# Patient Record
Sex: Male | Born: 1950 | Race: White | Hispanic: No | Marital: Single | Smoking: Former smoker
Health system: Southern US, Community
[De-identification: ages and names within clinical notes are randomized; demographics above are authoritative.]

## PROBLEM LIST (undated history)

## (undated) DIAGNOSIS — I10 Essential (primary) hypertension: Secondary | ICD-10-CM

## (undated) DIAGNOSIS — E785 Hyperlipidemia, unspecified: Secondary | ICD-10-CM

## (undated) HISTORY — PX: CHOLECYSTECTOMY: SHX55

## (undated) HISTORY — DX: Essential (primary) hypertension: I10

## (undated) HISTORY — DX: Hyperlipidemia, unspecified: E78.5

---

## 1999-10-06 ENCOUNTER — Other Ambulatory Visit: Admission: RE | Admit: 1999-10-06 | Discharge: 1999-10-06 | Payer: Self-pay | Admitting: Internal Medicine

## 1999-10-06 ENCOUNTER — Encounter (INDEPENDENT_AMBULATORY_CARE_PROVIDER_SITE_OTHER): Payer: Self-pay | Admitting: Specialist

## 2004-10-06 ENCOUNTER — Encounter: Admission: RE | Admit: 2004-10-06 | Discharge: 2004-10-06 | Payer: Self-pay | Admitting: Family Medicine

## 2005-08-29 IMAGING — CR DG CHEST 2V
2 series · 2 of 2 positions shown · non-contrast
Comparison: none

CLINICAL DATA: Hypertension.  30-pack-year history of smoking.
 TWO VIEW CHEST RADIOGRAPH 
 No prior studies.

[view not recorded (1 of 2)]
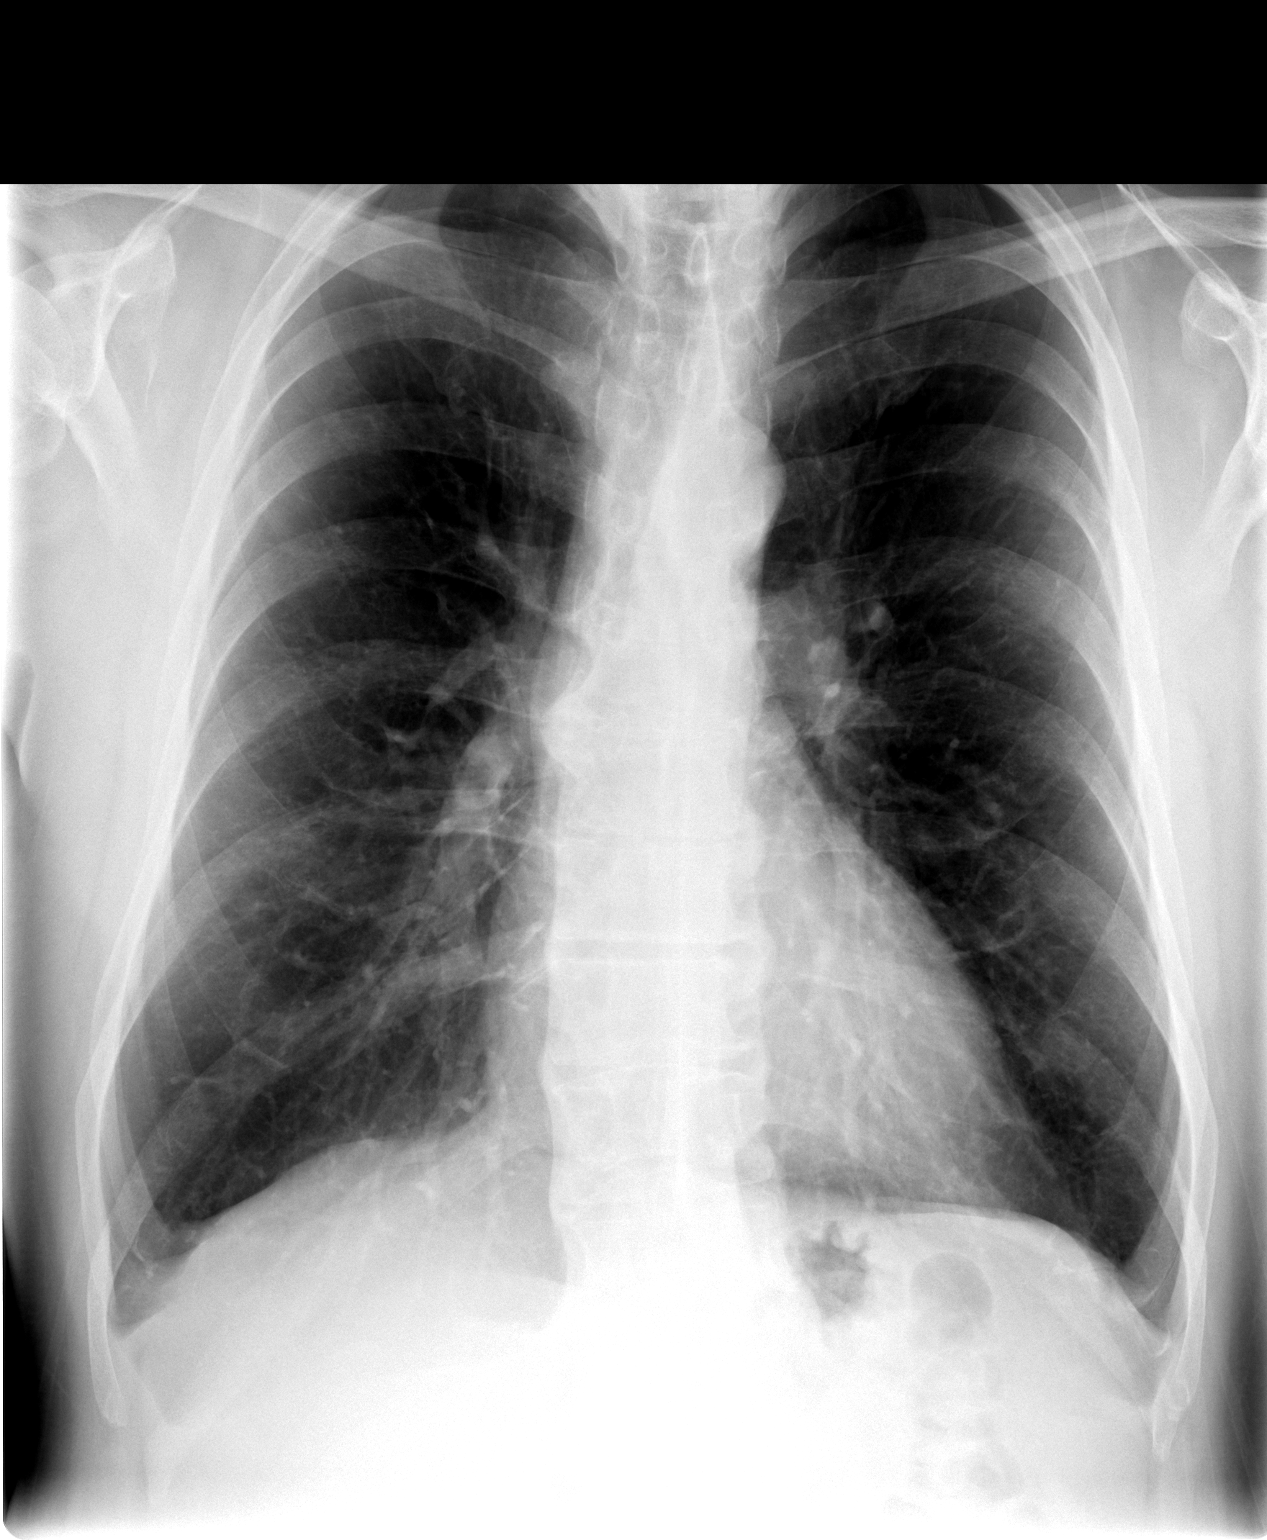

[view not recorded (2 of 2)]
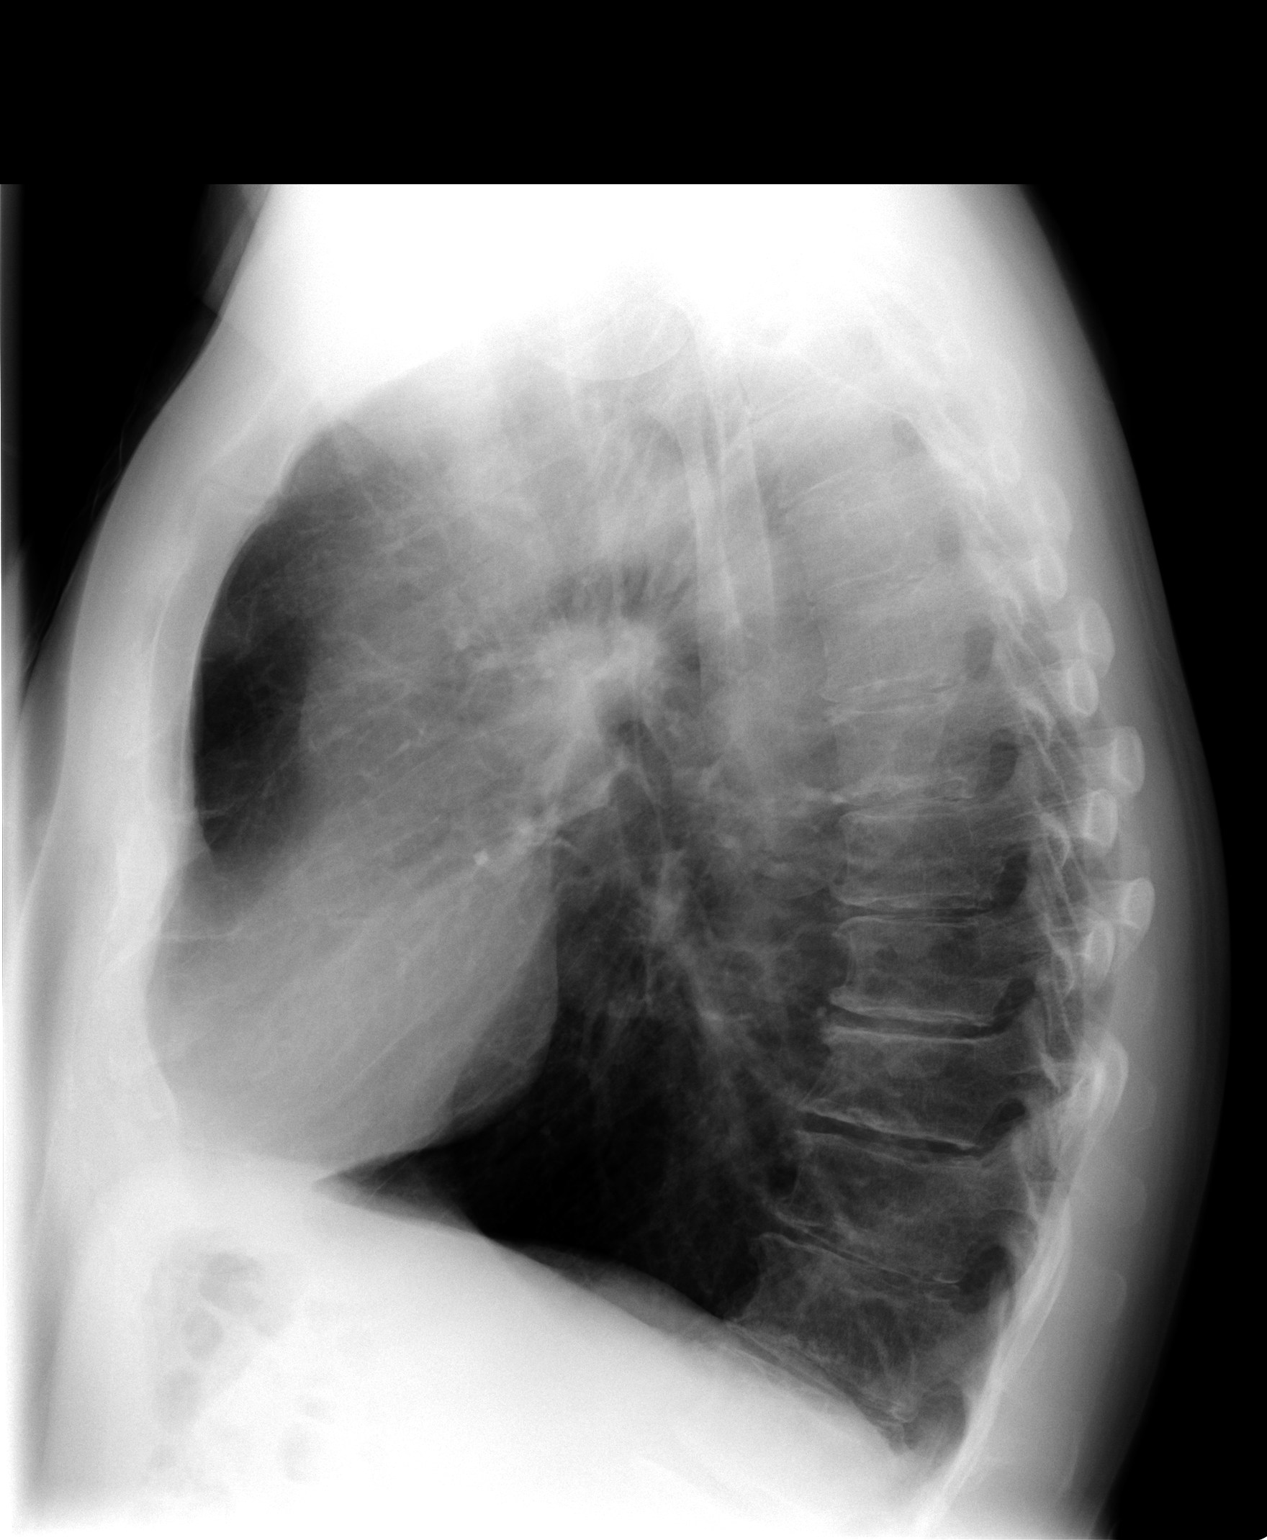

[2 of 2 positions shown; findings below may reference images not displayed]

FINDINGS: Large lung volumes are consistent with COPD.  Thoracic spondylosis is present.  Heart and mediastinum appear unremarkable.
 IMPRESSION
 1.  COPD. 
 2.  No acute findings.

## 2005-11-09 ENCOUNTER — Ambulatory Visit: Payer: Self-pay | Admitting: Family Medicine

## 2005-11-16 ENCOUNTER — Ambulatory Visit: Payer: Self-pay | Admitting: Family Medicine

## 2005-11-16 ENCOUNTER — Encounter: Admission: RE | Admit: 2005-11-16 | Discharge: 2005-11-16 | Payer: Self-pay | Admitting: Family Medicine

## 2006-01-14 ENCOUNTER — Ambulatory Visit: Payer: Self-pay | Admitting: Family Medicine

## 2006-10-09 IMAGING — CR DG CHEST 2V
3 series · 3 of 3 positions shown · non-contrast
Comparison: 10/06/04.

CLINICAL DATA: Former smoker.  Screening. 
 CHEST, TWO VIEWS:

[view not recorded (1 of 3)]
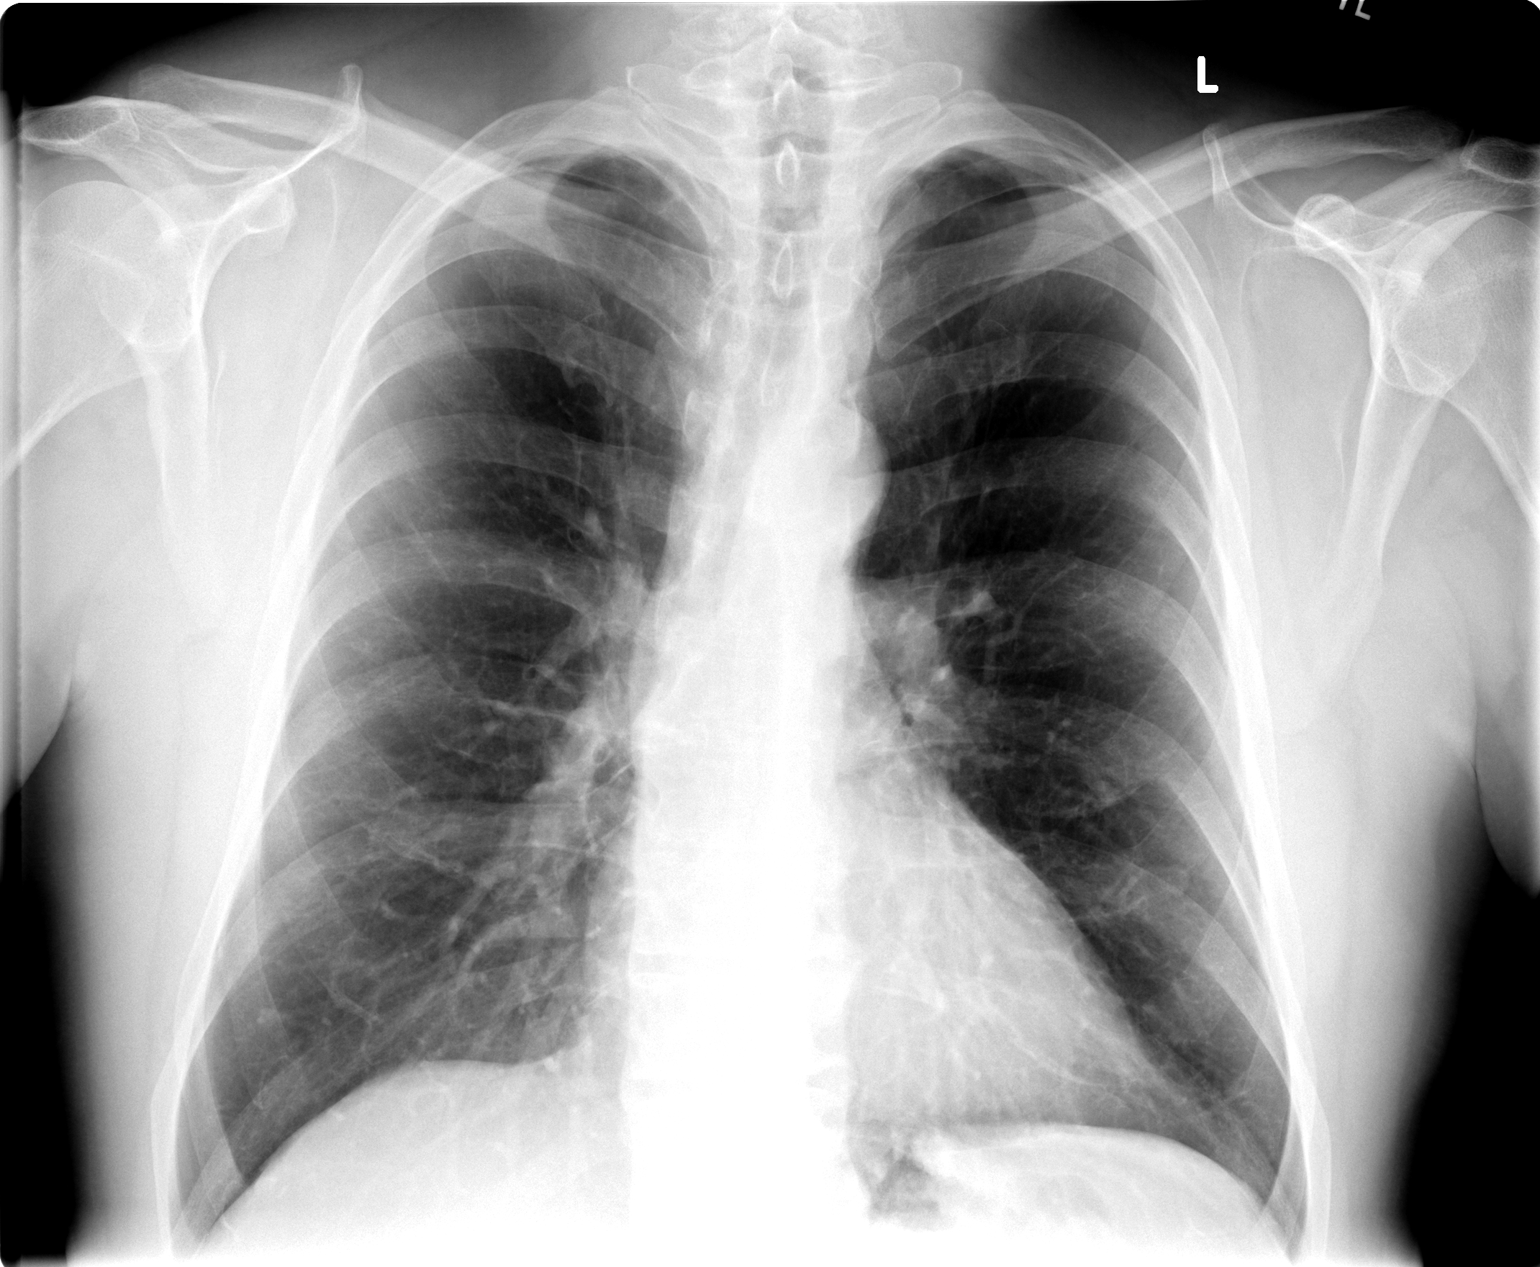

[view not recorded (2 of 3)]
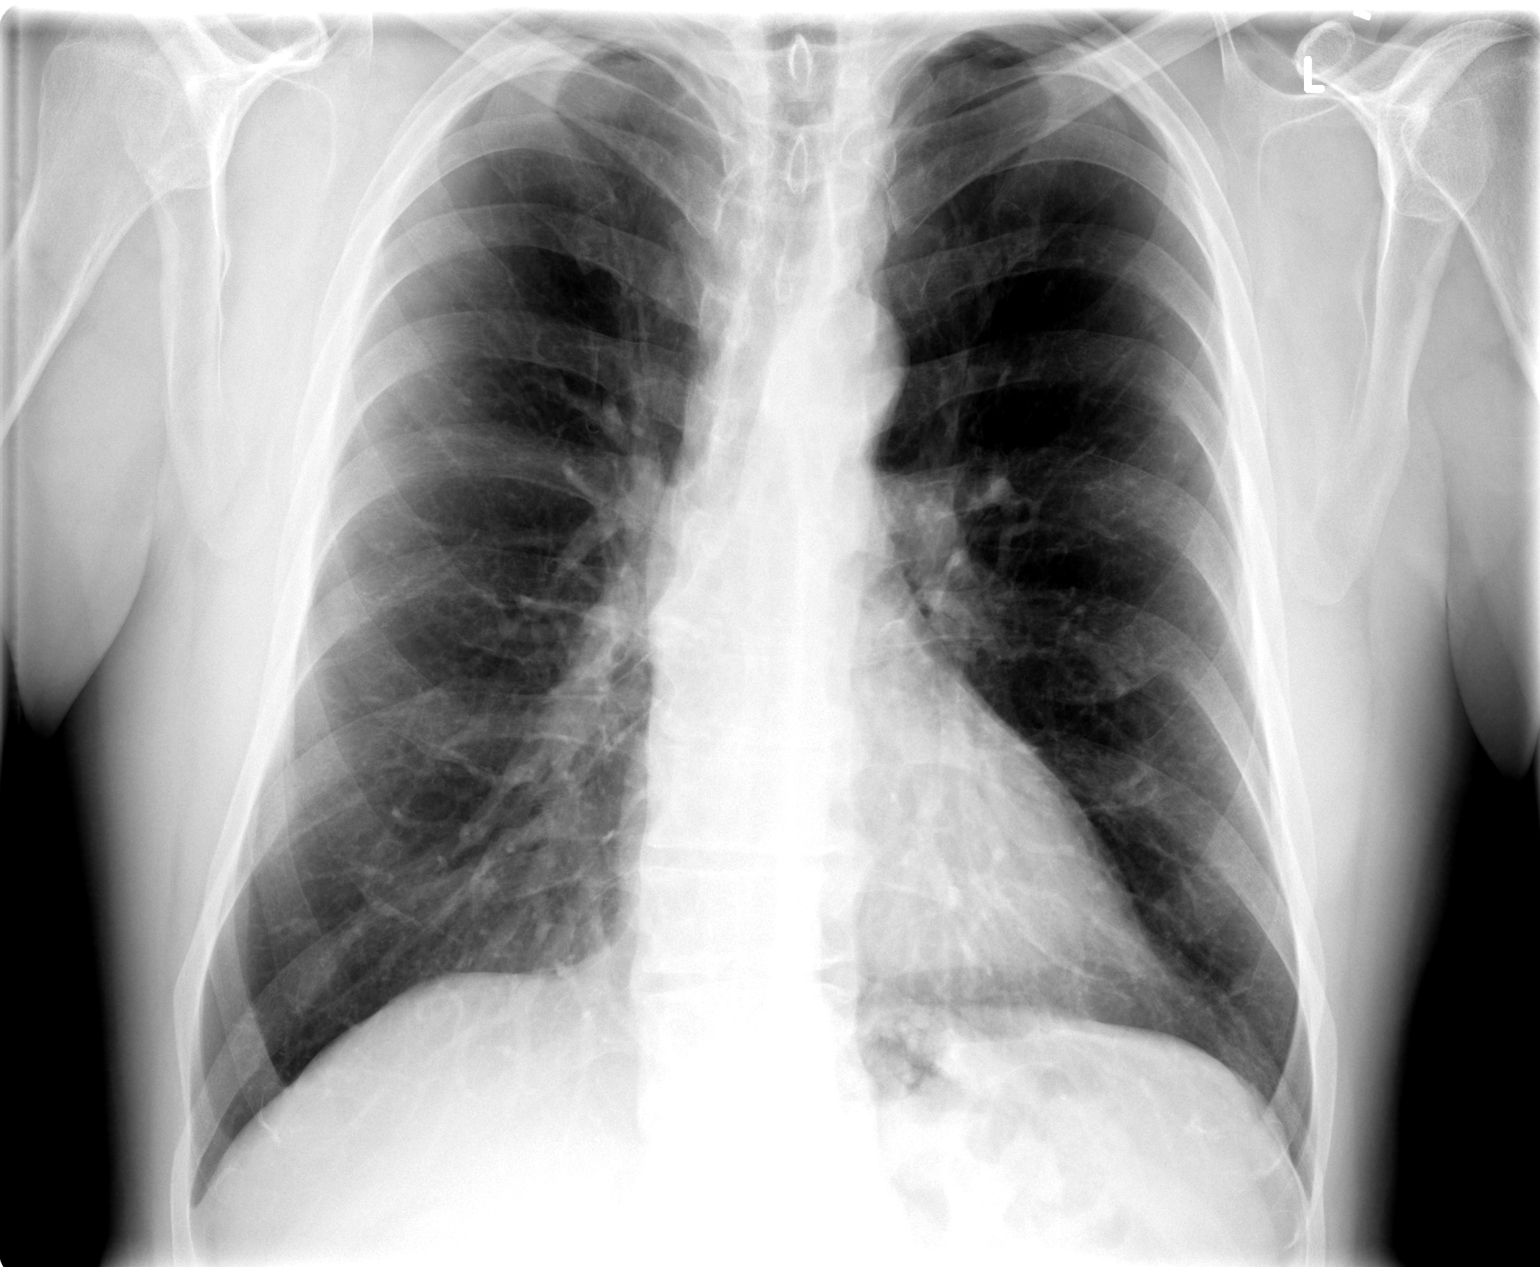

[view not recorded (3 of 3)]
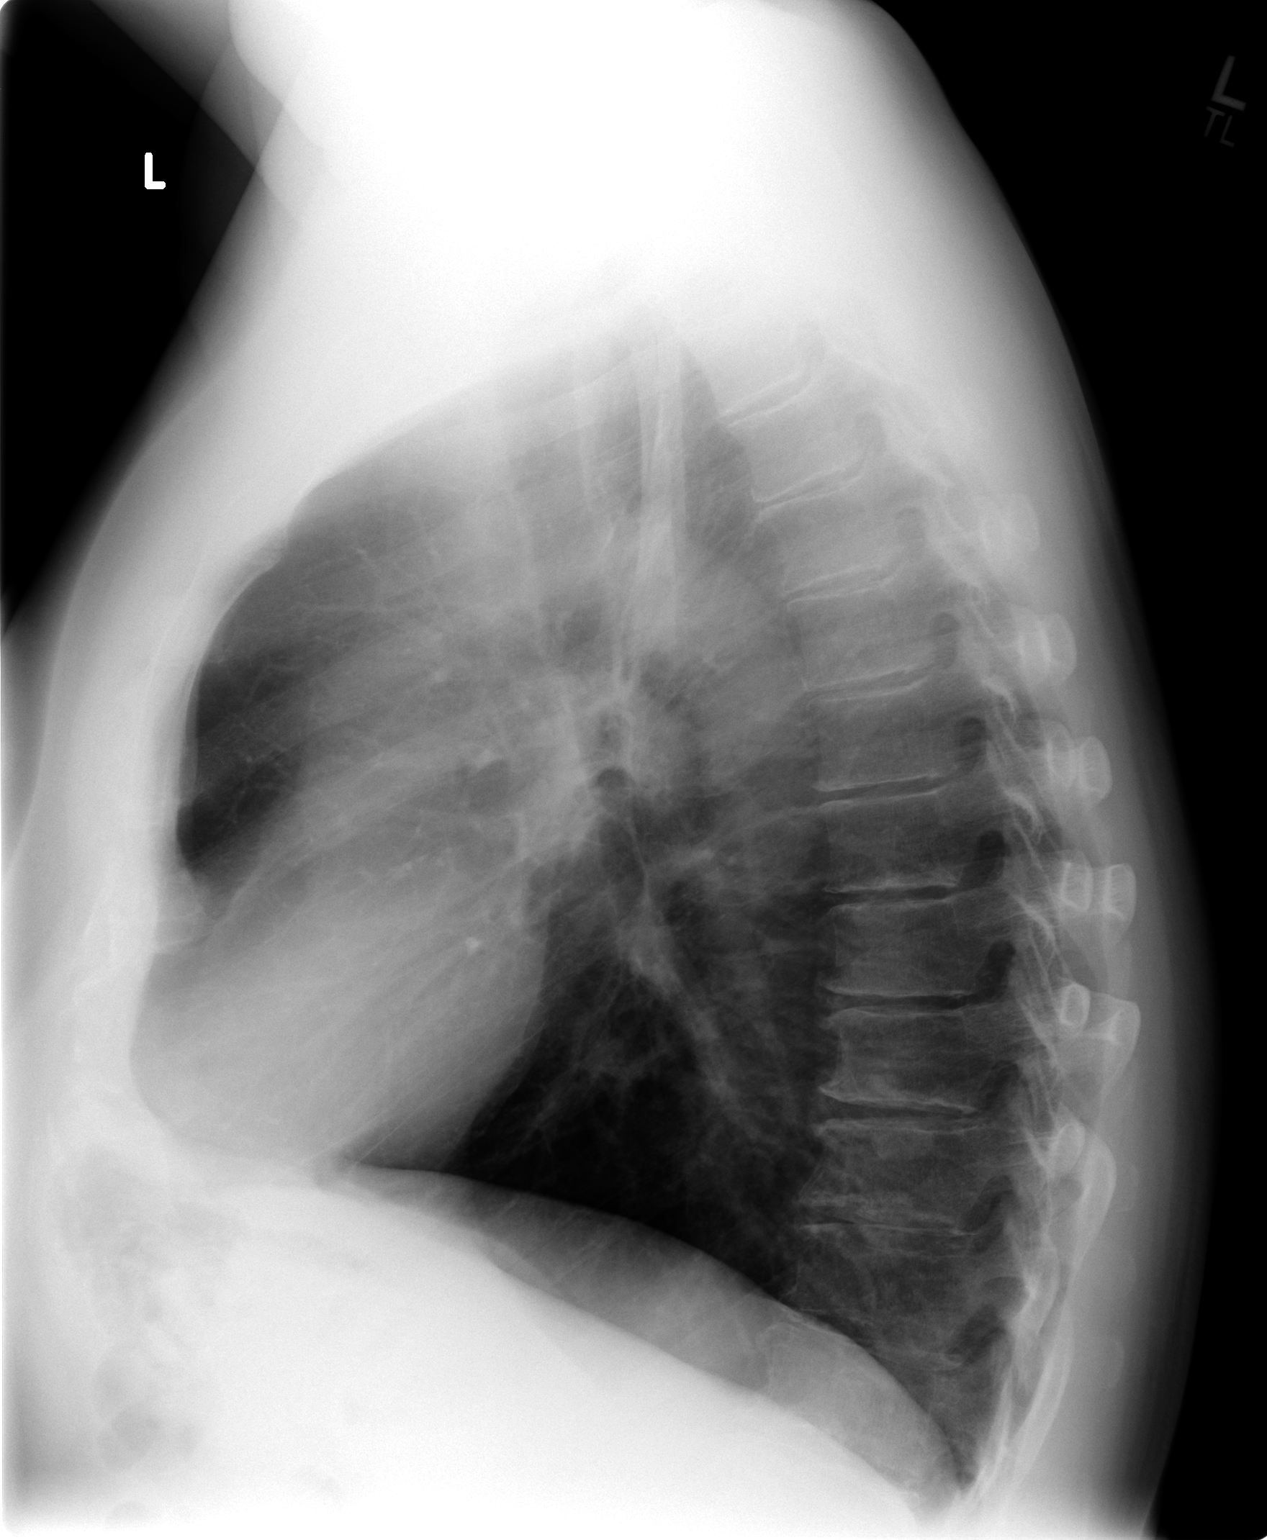

[3 of 3 positions shown; findings below may reference images not displayed]

Heart size is normal.  There is no heart failure, infiltrate, or effusion.  There is mild hyperinflation.  There is no mass or adenopathy.
IMPRESSION: Mild COPD.  No acute abnormality.

## 2006-11-08 ENCOUNTER — Ambulatory Visit: Payer: Self-pay | Admitting: Family Medicine

## 2006-11-08 LAB — CONVERTED CEMR LAB
ALT: 37 units/L (ref 0–40)
AST: 29 units/L (ref 0–37)
Albumin: 4.2 g/dL (ref 3.5–5.2)
Alkaline Phosphatase: 50 units/L (ref 39–117)
BUN: 14 mg/dL (ref 6–23)
Basophils Absolute: 0 10*3/uL (ref 0.0–0.1)
Basophils Relative: 0.9 % (ref 0.0–1.0)
CO2: 29 meq/L (ref 19–32)
Calcium: 9.8 mg/dL (ref 8.4–10.5)
Chloride: 104 meq/L (ref 96–112)
Chol/HDL Ratio, serum: 2.6
Cholesterol: 205 mg/dL (ref 0–200)
Creatinine, Ser: 0.9 mg/dL (ref 0.4–1.5)
Eosinophil percent: 3 % (ref 0.0–5.0)
GFR calc non Af Amer: 93 mL/min
Glomerular Filtration Rate, Af Am: 113 mL/min/{1.73_m2}
Glucose, Bld: 110 mg/dL — ABNORMAL HIGH (ref 70–99)
HCT: 45.1 % (ref 39.0–52.0)
HDL: 78.2 mg/dL (ref >39.0)
Hemoglobin: 15 g/dL (ref 13.0–17.0)
Hgb A1c MFr Bld: 5.4 % (ref 4.6–6.0)
LDL DIRECT: 119.8 mg/dL
Lymphocytes Relative: 19.7 % (ref 12.0–46.0)
MCHC: 33.4 g/dL (ref 30.0–36.0)
MCV: 93 fL (ref 78.0–100.0)
Monocytes Absolute: 0.5 10*3/uL (ref 0.2–0.7)
Monocytes Relative: 10 % (ref 3.0–11.0)
Neutro Abs: 3.4 10*3/uL (ref 1.4–7.7)
Neutrophils Relative %: 66.4 % (ref 43.0–77.0)
PSA: 0.78 ng/mL (ref 0.10–4.00)
Platelets: 303 10*3/uL (ref 150–400)
Potassium: 4.7 meq/L (ref 3.5–5.1)
RBC: 4.84 M/uL (ref 4.22–5.81)
RDW: 11.9 % (ref 11.5–14.6)
Sodium: 141 meq/L (ref 135–145)
TSH: 1.19 microintl units/mL (ref 0.35–5.50)
Total Bilirubin: 0.6 mg/dL (ref 0.3–1.2)
Total Protein: 7.4 g/dL (ref 6.0–8.3)
Triglyceride fasting, serum: 49 mg/dL (ref 0–149)
VLDL: 10 mg/dL (ref 0–40)
WBC: 5.1 10*3/uL (ref 4.5–10.5)

## 2006-11-12 ENCOUNTER — Ambulatory Visit: Payer: Self-pay | Admitting: Family Medicine

## 2007-01-13 ENCOUNTER — Ambulatory Visit: Payer: Self-pay | Admitting: Family Medicine

## 2007-01-13 LAB — CONVERTED CEMR LAB
Amylase: 55 units/L (ref 27–131)
BUN: 15 mg/dL (ref 6–23)
Basophils Absolute: 0.1 10*3/uL (ref 0.0–0.1)
Basophils Relative: 0.8 % (ref 0.0–1.0)
CO2: 28 meq/L (ref 19–32)
Calcium: 10.2 mg/dL (ref 8.4–10.5)
Chloride: 106 meq/L (ref 96–112)
Creatinine, Ser: 1.1 mg/dL (ref 0.4–1.5)
Eosinophil percent: 1.4 % (ref 0.0–5.0)
GFR calc non Af Amer: 74 mL/min
Glomerular Filtration Rate, Af Am: 89 mL/min/{1.73_m2}
Glucose, Bld: 93 mg/dL (ref 70–99)
HCT: 42.5 % (ref 39.0–52.0)
Hemoglobin: 14.5 g/dL (ref 13.0–17.0)
Lipase: 25 units/L (ref 11.0–59.0)
Lymphocytes Relative: 16 % (ref 12.0–46.0)
MCHC: 34.2 g/dL (ref 30.0–36.0)
MCV: 93 fL (ref 78.0–100.0)
Monocytes Absolute: 0.7 10*3/uL (ref 0.2–0.7)
Monocytes Relative: 8.2 % (ref 3.0–11.0)
Neutro Abs: 5.8 10*3/uL (ref 1.4–7.7)
Neutrophils Relative %: 73.6 % (ref 43.0–77.0)
Platelets: 324 10*3/uL (ref 150–400)
Potassium: 5.5 meq/L — ABNORMAL HIGH (ref 3.5–5.1)
RBC: 4.57 M/uL (ref 4.22–5.81)
RDW: 11.8 % (ref 11.5–14.6)
Sodium: 140 meq/L (ref 135–145)
WBC: 8 10*3/uL (ref 4.5–10.5)

## 2007-01-14 ENCOUNTER — Ambulatory Visit: Payer: Self-pay | Admitting: Internal Medicine

## 2007-01-15 ENCOUNTER — Ambulatory Visit: Payer: Self-pay | Admitting: Family Medicine

## 2007-01-21 ENCOUNTER — Ambulatory Visit (HOSPITAL_COMMUNITY): Admission: RE | Admit: 2007-01-21 | Discharge: 2007-01-21 | Payer: Self-pay | Admitting: *Deleted

## 2007-01-21 ENCOUNTER — Encounter (INDEPENDENT_AMBULATORY_CARE_PROVIDER_SITE_OTHER): Payer: Self-pay | Admitting: *Deleted

## 2007-09-24 DIAGNOSIS — E785 Hyperlipidemia, unspecified: Secondary | ICD-10-CM | POA: Insufficient documentation

## 2007-09-24 DIAGNOSIS — I10 Essential (primary) hypertension: Secondary | ICD-10-CM

## 2007-11-14 ENCOUNTER — Ambulatory Visit: Payer: Self-pay | Admitting: Family Medicine

## 2007-11-14 LAB — CONVERTED CEMR LAB
ALT: 29 units/L (ref 0–53)
AST: 24 units/L (ref 0–37)
Albumin: 4.1 g/dL (ref 3.5–5.2)
Alkaline Phosphatase: 48 units/L (ref 39–117)
BUN: 15 mg/dL (ref 6–23)
Basophils Absolute: 0 10*3/uL (ref 0.0–0.1)
Basophils Relative: 0.5 % (ref 0.0–1.0)
Bilirubin Urine: NEGATIVE
Bilirubin, Direct: 0.1 mg/dL (ref 0.0–0.3)
CO2: 28 meq/L (ref 19–32)
Calcium: 9.8 mg/dL (ref 8.4–10.5)
Chloride: 106 meq/L (ref 96–112)
Cholesterol: 201 mg/dL (ref 0–200)
Creatinine, Ser: 0.8 mg/dL (ref 0.4–1.5)
Direct LDL: 111 mg/dL
Eosinophils Absolute: 0.2 10*3/uL (ref 0.0–0.6)
Eosinophils Relative: 5.4 % — ABNORMAL HIGH (ref 0.0–5.0)
GFR calc Af Amer: 129 mL/min
GFR calc non Af Amer: 106 mL/min
Glucose, Bld: 97 mg/dL (ref 70–99)
Glucose, Urine, Semiquant: 100
HCT: 41.9 % (ref 39.0–52.0)
HDL: 71.5 mg/dL (ref >39.0)
Hemoglobin: 14.7 g/dL (ref 13.0–17.0)
Ketones, urine, test strip: NEGATIVE
Lymphocytes Relative: 24 % (ref 12.0–46.0)
MCHC: 35 g/dL (ref 30.0–36.0)
MCV: 92.7 fL (ref 78.0–100.0)
Monocytes Absolute: 0.4 10*3/uL (ref 0.2–0.7)
Monocytes Relative: 9.8 % (ref 3.0–11.0)
Neutro Abs: 2.7 10*3/uL (ref 1.4–7.7)
Neutrophils Relative %: 60.3 % (ref 43.0–77.0)
Nitrite: NEGATIVE
PSA: 0.33 ng/mL (ref 0.10–4.00)
Platelets: 260 10*3/uL (ref 150–400)
Potassium: 5.1 meq/L (ref 3.5–5.1)
Protein, U semiquant: NEGATIVE
RBC: 4.51 M/uL (ref 4.22–5.81)
RDW: 11.7 % (ref 11.5–14.6)
Sodium: 140 meq/L (ref 135–145)
Specific Gravity, Urine: 1.02
TSH: 1.13 microintl units/mL (ref 0.35–5.50)
Total Bilirubin: 0.8 mg/dL (ref 0.3–1.2)
Total CHOL/HDL Ratio: 2.8
Total Protein: 6.6 g/dL (ref 6.0–8.3)
Triglycerides: 45 mg/dL (ref 0–149)
Urobilinogen, UA: 0.2
VLDL: 9 mg/dL (ref 0–40)
WBC Urine, dipstick: NEGATIVE
WBC: 4.4 10*3/uL — ABNORMAL LOW (ref 4.5–10.5)
pH: 5.5

## 2007-11-24 ENCOUNTER — Ambulatory Visit: Payer: Self-pay | Admitting: Family Medicine

## 2007-12-07 IMAGING — US US ABDOMEN COMPLETE
1 series · 14 of 25 positions shown · non-contrast
Comparison: none

ACCESSION NUMBER:  [id number].

 READING PHYSICIAN:  Ibrahim, Marquette
 INDICATIONS:  Right upper quadrant abdominal pain.

[Series 1: us abdomen complete · 0.28mm/px · 14 of 55 slices shown]
[im 1/55]
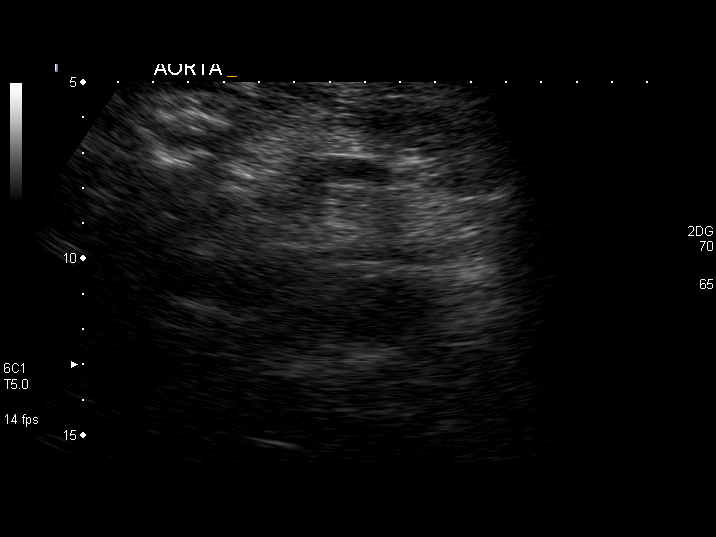
[im 5/55]
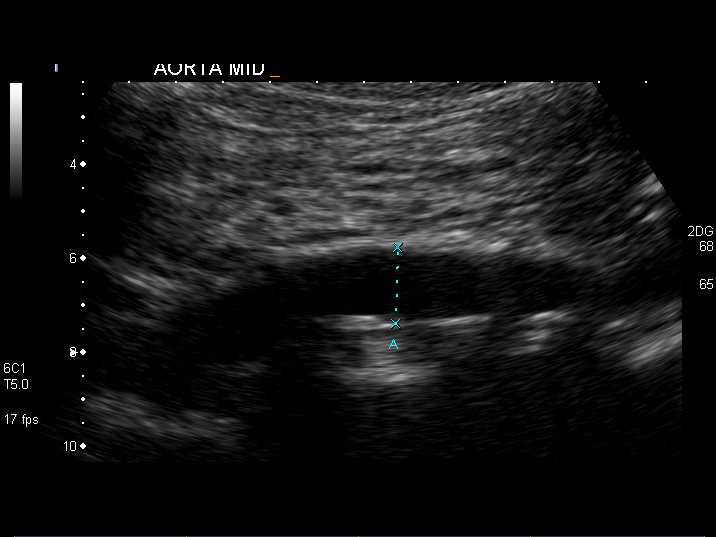
[im 10/55]
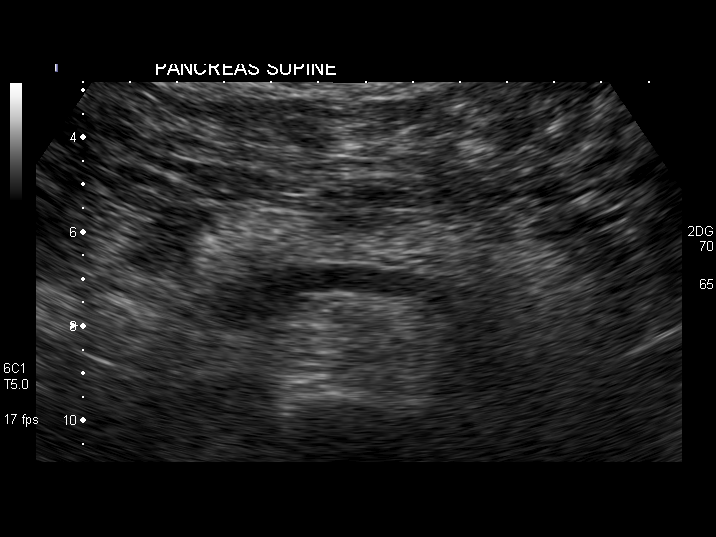
[im 14/55]
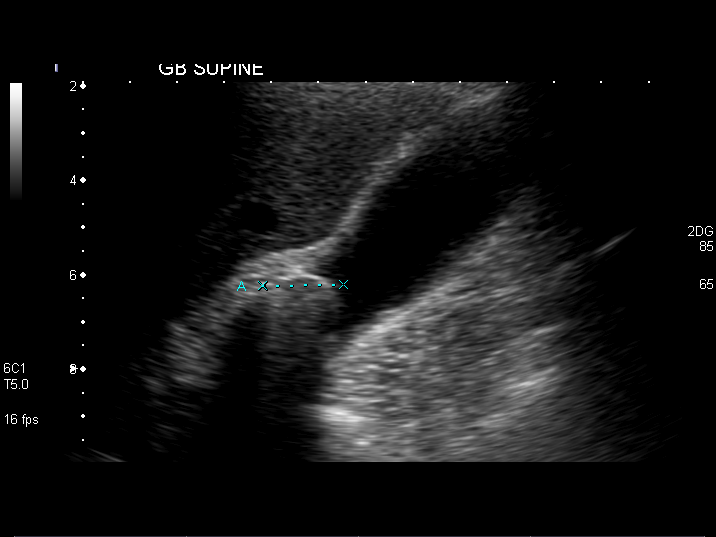
[im 19/55]
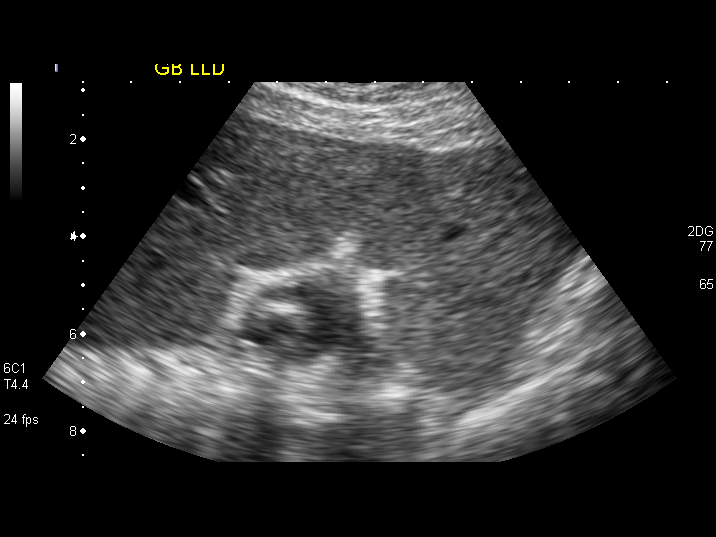
[im 21/55]
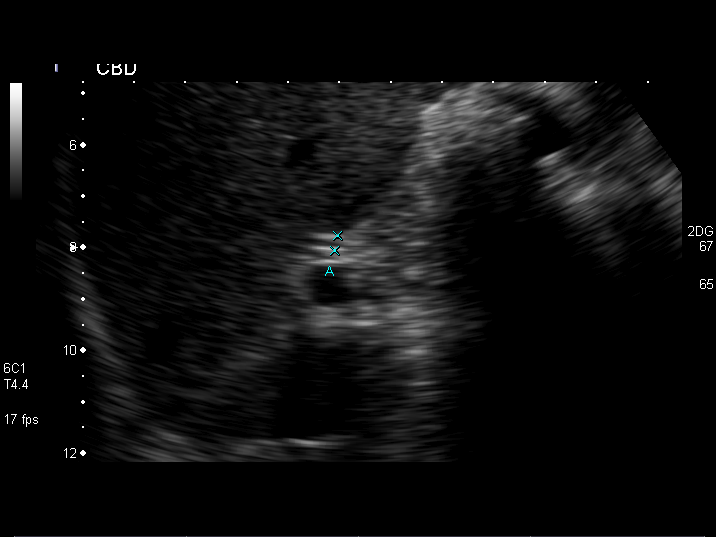
[im 25/55]
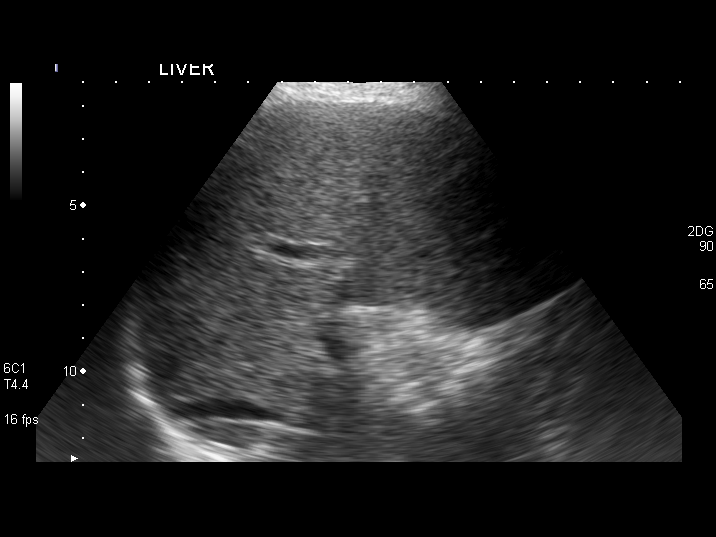
[im 30/55]
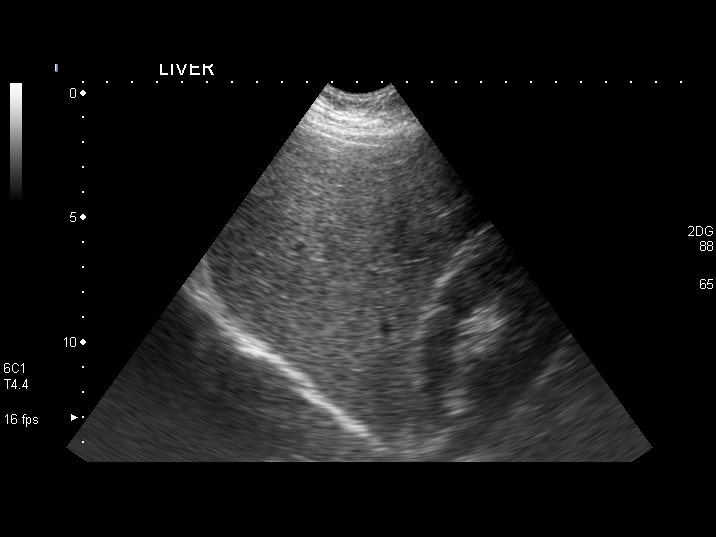
[im 34/55]
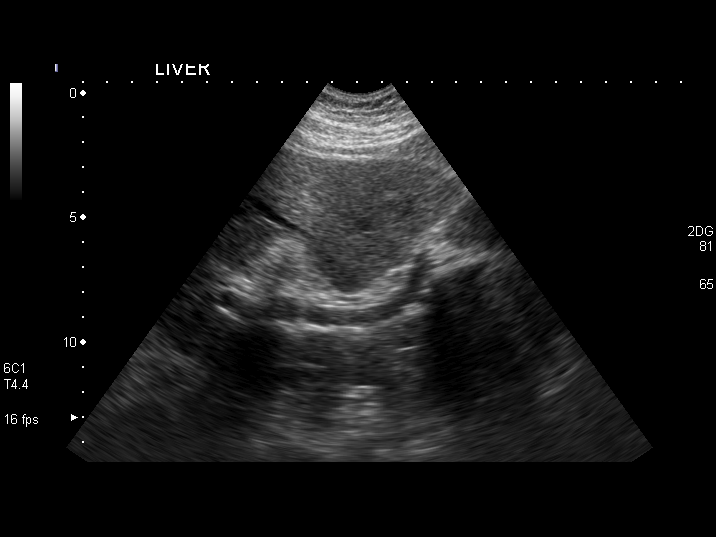
[im 37/55]
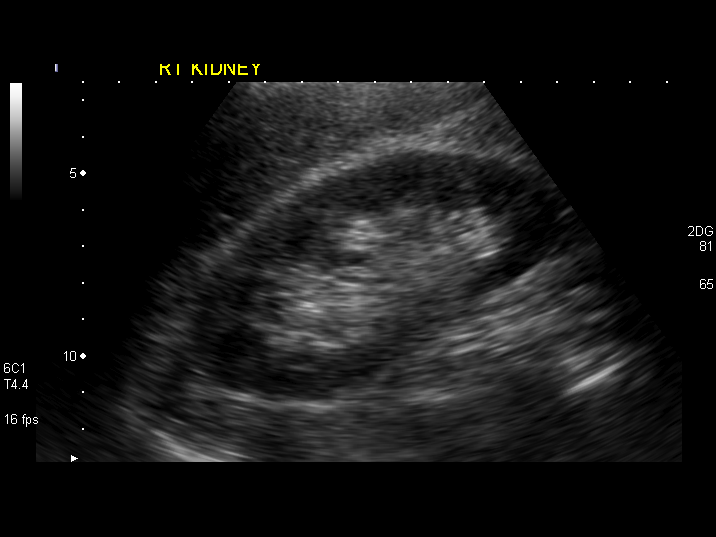
[im 41/55]
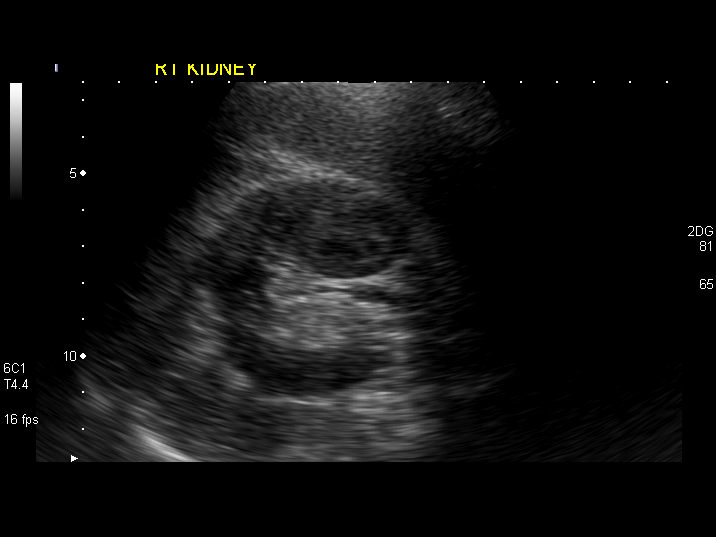
[im 46/55]
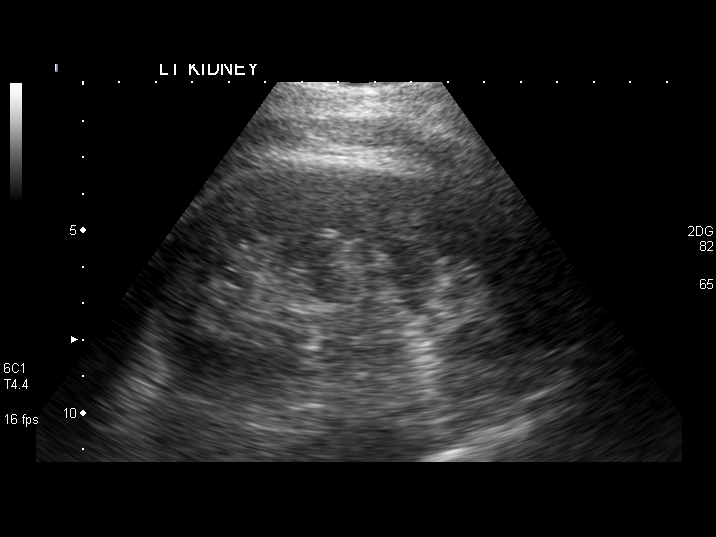
[im 50/55]
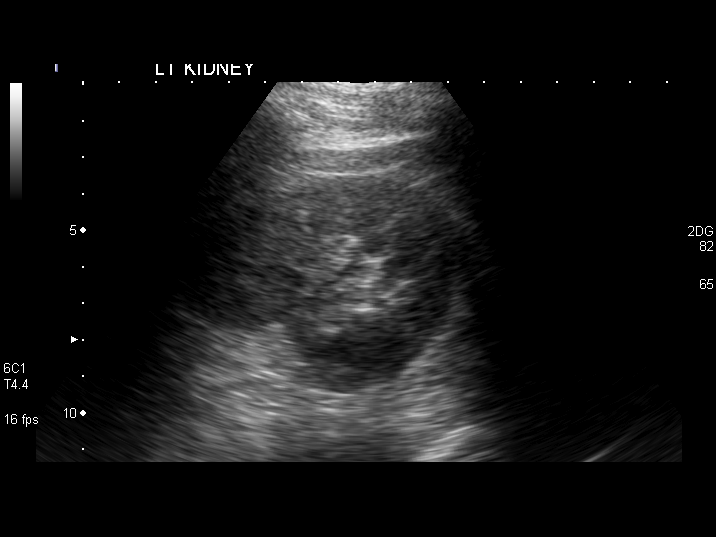
[im 55/55]
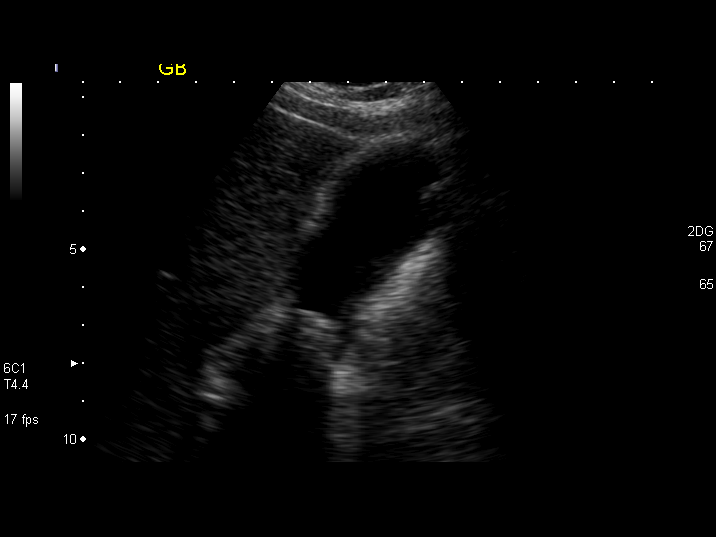

[14 of 25 positions shown; findings below may reference images not displayed]

FINDINGS: The abdominal aorta  measures 26 mm. The inferior vena cava is patent. 

 The pancreas appears normal throughout the head, body and tail without evidence of ductal dilatation, pancreatic masses or peripancreatic inflammation. 

 Gallbladder with marked thickening of the gallbladder wall up to 6-9 mm in diameter consistent with chronic cholecystitis. Large solitary gallstone measuring 29 mm enlodged at the neck of the gallbladder with shadowing. Several small nonshadowing stones in the fundus of the gallbladder. No pericholecystic fluid. 

 Common bile duct 3 mm. 

 The liver appears normal without evidence of parenchymal lesion, ductal dilatation or vascular abnormality. 

 Right kidney 106 mm, left 114 mm. 

 Spleen 101 mm.
IMPRESSION: 1.         A large gallstone at the neck of the gallbladder with evidence of chronic cholecystitis. 
 2.         Two small stones in the fundus of the gallbladder. 
 3.         Normal common bile duct pancreas and liver.

## 2008-11-12 ENCOUNTER — Ambulatory Visit: Payer: Self-pay | Admitting: Family Medicine

## 2008-11-12 LAB — CONVERTED CEMR LAB
ALT: 30 units/L (ref 0–53)
AST: 26 units/L (ref 0–37)
Albumin: 4.2 g/dL (ref 3.5–5.2)
Alkaline Phosphatase: 48 units/L (ref 39–117)
BUN: 15 mg/dL (ref 6–23)
Basophils Absolute: 0 10*3/uL (ref 0.0–0.1)
Basophils Relative: 1.1 % (ref 0.0–3.0)
Bilirubin Urine: NEGATIVE
Bilirubin, Direct: 0.1 mg/dL (ref 0.0–0.3)
Blood in Urine, dipstick: NEGATIVE
CO2: 30 meq/L (ref 19–32)
Calcium: 9.6 mg/dL (ref 8.4–10.5)
Chloride: 108 meq/L (ref 96–112)
Cholesterol: 207 mg/dL (ref 0–200)
Creatinine, Ser: 0.9 mg/dL (ref 0.4–1.5)
Direct LDL: 104.9 mg/dL
Eosinophils Absolute: 0.3 10*3/uL (ref 0.0–0.7)
Eosinophils Relative: 5.7 % — ABNORMAL HIGH (ref 0.0–5.0)
GFR calc Af Amer: 112 mL/min
GFR calc non Af Amer: 92 mL/min
Glucose, Bld: 107 mg/dL — ABNORMAL HIGH (ref 70–99)
Glucose, Urine, Semiquant: NEGATIVE
HCT: 43.1 % (ref 39.0–52.0)
HDL: 73.8 mg/dL (ref >39.0)
Hemoglobin: 14.7 g/dL (ref 13.0–17.0)
Ketones, urine, test strip: NEGATIVE
Lymphocytes Relative: 23.9 % (ref 12.0–46.0)
MCHC: 34.2 g/dL (ref 30.0–36.0)
MCV: 96.1 fL (ref 78.0–100.0)
Monocytes Absolute: 0.5 10*3/uL (ref 0.1–1.0)
Monocytes Relative: 11.3 % (ref 3.0–12.0)
Neutro Abs: 2.6 10*3/uL (ref 1.4–7.7)
Neutrophils Relative %: 58 % (ref 43.0–77.0)
Nitrite: NEGATIVE
PSA: 0.58 ng/mL (ref 0.10–4.00)
Platelets: 228 10*3/uL (ref 150–400)
Potassium: 4.6 meq/L (ref 3.5–5.1)
Protein, U semiquant: NEGATIVE
RBC: 4.48 M/uL (ref 4.22–5.81)
RDW: 12.2 % (ref 11.5–14.6)
Sodium: 145 meq/L (ref 135–145)
Specific Gravity, Urine: 1.02
TSH: 1.32 microintl units/mL (ref 0.35–5.50)
Total Bilirubin: 0.7 mg/dL (ref 0.3–1.2)
Total CHOL/HDL Ratio: 2.8
Total Protein: 6.9 g/dL (ref 6.0–8.3)
Triglycerides: 57 mg/dL (ref 0–149)
Urobilinogen, UA: 0.2
VLDL: 11 mg/dL (ref 0–40)
WBC Urine, dipstick: NEGATIVE
WBC: 4.5 10*3/uL (ref 4.5–10.5)
pH: 6

## 2008-11-22 ENCOUNTER — Ambulatory Visit: Payer: Self-pay | Admitting: Family Medicine

## 2008-11-22 DIAGNOSIS — N529 Male erectile dysfunction, unspecified: Secondary | ICD-10-CM | POA: Insufficient documentation

## 2008-11-22 DIAGNOSIS — F528 Other sexual dysfunction not due to a substance or known physiological condition: Secondary | ICD-10-CM

## 2008-12-15 ENCOUNTER — Telehealth: Payer: Self-pay | Admitting: *Deleted

## 2008-12-17 ENCOUNTER — Telehealth: Payer: Self-pay | Admitting: Family Medicine

## 2009-11-11 ENCOUNTER — Ambulatory Visit: Payer: Self-pay | Admitting: Family Medicine

## 2009-11-11 LAB — CONVERTED CEMR LAB
ALT: 31 units/L (ref 0–53)
AST: 21 units/L (ref 0–37)
Albumin: 4 g/dL (ref 3.5–5.2)
Alkaline Phosphatase: 50 units/L (ref 39–117)
BUN: 15 mg/dL (ref 6–23)
Basophils Absolute: 0 10*3/uL (ref 0.0–0.1)
Basophils Relative: 0.9 % (ref 0.0–3.0)
Bilirubin Urine: NEGATIVE
Bilirubin, Direct: 0.1 mg/dL (ref 0.0–0.3)
Blood in Urine, dipstick: NEGATIVE
CO2: 26 meq/L (ref 19–32)
Calcium: 9.2 mg/dL (ref 8.4–10.5)
Chloride: 108 meq/L (ref 96–112)
Cholesterol: 210 mg/dL — ABNORMAL HIGH (ref 0–200)
Creatinine, Ser: 0.9 mg/dL (ref 0.4–1.5)
Direct LDL: 125.4 mg/dL
Eosinophils Absolute: 0.2 10*3/uL (ref 0.0–0.7)
Eosinophils Relative: 5.2 % — ABNORMAL HIGH (ref 0.0–5.0)
GFR calc non Af Amer: 91.88 mL/min (ref >60)
Glucose, Bld: 104 mg/dL — ABNORMAL HIGH (ref 70–99)
Glucose, Urine, Semiquant: NEGATIVE
HCT: 40.5 % (ref 39.0–52.0)
HDL: 72.7 mg/dL (ref >39.00)
Hemoglobin: 13.9 g/dL (ref 13.0–17.0)
Ketones, urine, test strip: NEGATIVE
Lymphocytes Relative: 21 % (ref 12.0–46.0)
Lymphs Abs: 0.9 10*3/uL (ref 0.7–4.0)
MCHC: 34.4 g/dL (ref 30.0–36.0)
MCV: 95 fL (ref 78.0–100.0)
Monocytes Absolute: 0.5 10*3/uL (ref 0.1–1.0)
Monocytes Relative: 11.2 % (ref 3.0–12.0)
Neutro Abs: 2.8 10*3/uL (ref 1.4–7.7)
Neutrophils Relative %: 61.7 % (ref 43.0–77.0)
Nitrite: NEGATIVE
PSA: 0.42 ng/mL (ref 0.10–4.00)
Platelets: 208 10*3/uL (ref 150.0–400.0)
Potassium: 4.6 meq/L (ref 3.5–5.1)
Protein, U semiquant: NEGATIVE
RBC: 4.27 M/uL (ref 4.22–5.81)
RDW: 12.1 % (ref 11.5–14.6)
Sodium: 140 meq/L (ref 135–145)
Specific Gravity, Urine: 1.02
TSH: 1.13 microintl units/mL (ref 0.35–5.50)
Total Bilirubin: 0.9 mg/dL (ref 0.3–1.2)
Total CHOL/HDL Ratio: 3
Total Protein: 6.8 g/dL (ref 6.0–8.3)
Triglycerides: 49 mg/dL (ref 0.0–149.0)
Urobilinogen, UA: 0.2
VLDL: 9.8 mg/dL (ref 0.0–40.0)
WBC Urine, dipstick: NEGATIVE
WBC: 4.4 10*3/uL — ABNORMAL LOW (ref 4.5–10.5)
pH: 5

## 2009-11-18 ENCOUNTER — Ambulatory Visit: Payer: Self-pay | Admitting: Family Medicine

## 2010-11-20 ENCOUNTER — Ambulatory Visit: Payer: Self-pay | Admitting: Internal Medicine

## 2010-11-21 ENCOUNTER — Telehealth: Payer: Self-pay | Admitting: Internal Medicine

## 2010-11-21 LAB — CONVERTED CEMR LAB
ALT: 27 units/L (ref 0–53)
AST: 20 units/L (ref 0–37)
Albumin: 4.5 g/dL (ref 3.5–5.2)
Alkaline Phosphatase: 57 units/L (ref 39–117)
BUN: 15 mg/dL (ref 6–23)
Basophils Absolute: 0 10*3/uL (ref 0.0–0.1)
Basophils Relative: 0.6 % (ref 0.0–3.0)
Bilirubin, Direct: 0.1 mg/dL (ref 0.0–0.3)
CO2: 27 meq/L (ref 19–32)
Calcium: 9.9 mg/dL (ref 8.4–10.5)
Chloride: 103 meq/L (ref 96–112)
Cholesterol: 241 mg/dL — ABNORMAL HIGH (ref 0–200)
Creatinine, Ser: 1 mg/dL (ref 0.4–1.5)
Direct LDL: 135.4 mg/dL
Eosinophils Absolute: 0.1 10*3/uL (ref 0.0–0.7)
Eosinophils Relative: 1.8 % (ref 0.0–5.0)
GFR calc non Af Amer: 82.99 mL/min (ref >60)
Glucose, Bld: 102 mg/dL — ABNORMAL HIGH (ref 70–99)
HCT: 42.9 % (ref 39.0–52.0)
HDL: 82.3 mg/dL (ref >39.00)
Hemoglobin: 15 g/dL (ref 13.0–17.0)
Lymphocytes Relative: 14.4 % (ref 12.0–46.0)
Lymphs Abs: 1 10*3/uL (ref 0.7–4.0)
MCHC: 34.9 g/dL (ref 30.0–36.0)
MCV: 92.6 fL (ref 78.0–100.0)
Monocytes Absolute: 0.5 10*3/uL (ref 0.1–1.0)
Monocytes Relative: 7.6 % (ref 3.0–12.0)
Neutro Abs: 5 10*3/uL (ref 1.4–7.7)
Neutrophils Relative %: 75.6 % (ref 43.0–77.0)
PSA: 6.86 ng/mL — ABNORMAL HIGH (ref 0.10–4.00)
Platelets: 265 10*3/uL (ref 150.0–400.0)
Potassium: 5.2 meq/L — ABNORMAL HIGH (ref 3.5–5.1)
RBC: 4.63 M/uL (ref 4.22–5.81)
RDW: 13.1 % (ref 11.5–14.6)
Sodium: 140 meq/L (ref 135–145)
Total Bilirubin: 0.6 mg/dL (ref 0.3–1.2)
Total CHOL/HDL Ratio: 3
Total Protein: 7.2 g/dL (ref 6.0–8.3)
Triglycerides: 66 mg/dL (ref 0.0–149.0)
VLDL: 13.2 mg/dL (ref 0.0–40.0)
WBC: 6.6 10*3/uL (ref 4.5–10.5)

## 2010-12-01 ENCOUNTER — Telehealth: Payer: Self-pay | Admitting: Internal Medicine

## 2010-12-18 ENCOUNTER — Ambulatory Visit: Payer: Self-pay | Admitting: Internal Medicine

## 2010-12-18 DIAGNOSIS — R972 Elevated prostate specific antigen [PSA]: Secondary | ICD-10-CM | POA: Insufficient documentation

## 2010-12-19 LAB — CONVERTED CEMR LAB
PSA: 0.96 ng/mL (ref 0.10–4.00)
Potassium: 5.4 meq/L — ABNORMAL HIGH (ref 3.5–5.1)

## 2010-12-20 ENCOUNTER — Encounter: Payer: Self-pay | Admitting: Internal Medicine

## 2011-01-18 ENCOUNTER — Telehealth (INDEPENDENT_AMBULATORY_CARE_PROVIDER_SITE_OTHER): Payer: Self-pay | Admitting: *Deleted

## 2011-01-19 ENCOUNTER — Ambulatory Visit: Admit: 2011-01-19 | Payer: Self-pay | Admitting: Internal Medicine

## 2011-01-19 ENCOUNTER — Ambulatory Visit
Admission: RE | Admit: 2011-01-19 | Discharge: 2011-01-19 | Payer: Self-pay | Source: Home / Self Care | Attending: Internal Medicine | Admitting: Internal Medicine

## 2011-01-19 ENCOUNTER — Encounter: Payer: Self-pay | Admitting: Internal Medicine

## 2011-01-22 LAB — CONVERTED CEMR LAB
BUN: 22 mg/dL (ref 6–23)
CO2: 25 meq/L (ref 19–32)
Calcium: 9.8 mg/dL (ref 8.4–10.5)
Chloride: 100 meq/L (ref 96–112)
Creatinine, Ser: 1.1 mg/dL (ref 0.40–1.50)
Glucose, Bld: 94 mg/dL (ref 70–99)
Potassium: 4.8 meq/L (ref 3.5–5.3)
Sodium: 137 meq/L (ref 135–145)

## 2011-01-30 NOTE — Assessment & Plan Note (Signed)
Summary: cpx & lab/cbs   Vital Signs:  Patient profile:   60 year old male Height:      71 inches Weight:      183.25 pounds BMI:     25.65 Pulse rate:   94 / minute Pulse rhythm:   regular BP sitting:   136 / 82  (left arm) Cuff size:   regular  Vitals Entered By: Army Fossa CMA (November 20, 2010 8:14 AM) CC: CPX, fasting  Comments Due for colonoscopy [facility]   History of Present Illness: CPX   Preventive Screening-Counseling & Management  Alcohol-Tobacco     Alcohol drinks/day: 1     Alcohol type: beer     Smoking Status: quit     Year Quit: 2005  Caffeine-Diet-Exercise     Type of exercise: active @ work  Current Medications (verified): 1)  Zocor 20 Mg  Tabs (Simvastatin) .... Take 1 Tablet By Mouth Every Morning 2)  Hydrochlorothiazide 12.5 Mg  Caps (Hydrochlorothiazide) .... Take 1 Capsule By Mouth Once A Day 3)  Zestril 40 Mg  Tabs (Lisinopril) .... Take 1 Tablet By Mouth Every Morning 4)  Adprin B 325 Mg  Tabs (Aspirin Buf(Cacarb-Mgcarb-Mgo)) .... Otc 5)  Viagra 50 Mg Tabs (Sildenafil Citrate) .... Uad  Allergies (verified): No Known Drug Allergies  Past History:  Past Medical History: HYPERLIPIDEMIA  HYPERTENSION  h/o DEPRESSION when lost wife   Past Surgical History: POLYPECTOMY  GANGLION CYST REMOVAL , HX OF   Cholecystectomy 2008  Family History: CAD-- denies  stroke -- M  high chol-- M DM-- M dx late in life  Hypertension-- no colon ca-- F , dx age 72 colon polyps.  Mother prostate ca--no  Social History: Single, lost wife in 75 2 kids  son lives w/ patient  Smoked 1 ppd from 47 to 68 y/o , then quit  Alcohol use--- beer x2 most nights  diet-- eats a variety of foods and vegetables  Drug use-no Regular exercise-yes, very active at work  Occupation: Location manager, Building control surveyor. Smoking Status:  quit  Review of Systems General:  Denies fever and weight loss. CV:  Denies chest pain or discomfort and swelling  of feet. Resp:  Denies cough and shortness of breath. GI:  Denies bloody stools, nausea, and vomiting. GU:  Denies dysuria, hematuria, urinary frequency, and urinary hesitancy. Psych:  ++ stress at work no depress at present .  Physical Exam  General:  alert, well-developed, and well-nourished.   Neck:  no masses, no thyromegaly, and normal carotid upstroke.   Lungs:  Normal respiratory effort, chest expands symmetrically. Lungs are clear to auscultation, no crackles or wheezes. Heart:  normal rate, regular rhythm, no murmur, and no gallop.   Abdomen:  soft, non-tender, no masses, no rigidity, no hepatomegaly, and no splenomegaly.   Rectal:  external hemorrhoids noted. Normal sphincter tone. No rectal masses or tenderness. Brown stools, Hemoccult negative Prostate:  Prostate gland firm and smooth, no enlargement, nodularity, tenderness, mass, asymmetry or induration. Extremities:  no lower extremity edema Neurologic:  alert & oriented X3, strength normal in all extremities, and gait normal.   Psych:  Oriented X3, memory intact for recent and remote, normally interactive, good eye contact, not anxious appearing, and not depressed appearing.     Impression & Recommendations:  Problem # 1:  HEALTH SCREENING (ICD-V70.0) Td 08 pneumonia shot 05 had a flu shot   last Cscope ?   will call GI labs Diet and exercise discussed  Orders: Venipuncture (16109) TLB-Lipid Panel (80061-LIPID) TLB-BMP (Basic Metabolic Panel-BMET) (80048-METABOL) TLB-CBC Platelet - w/Differential (85025-CBCD) TLB-PSA (Prostate Specific Antigen) (84153-PSA) TLB-Hepatic/Liver Function Pnl (80076-HEPATIC) Specimen Handling (60454)  Complete Medication List: 1)  Zocor 20 Mg Tabs (Simvastatin) .... Take 1 tablet by mouth every morning 2)  Hydrochlorothiazide 12.5 Mg Caps (Hydrochlorothiazide) .... Take 1 capsule by mouth once a day 3)  Zestril 40 Mg Tabs (Lisinopril) .... Take 1 tablet by mouth every morning 4)   Adprin B 325 Mg Tabs (Aspirin buf(cacarb-mgcarb-mgo)) .... Otc 5)  Viagra 50 Mg Tabs (Sildenafil citrate) .... Uad  Patient Instructions: 1)  Please schedule a follow-up appointment in 6 months .    Orders Added: 1)  Venipuncture [36415] 2)  TLB-Lipid Panel [80061-LIPID] 3)  TLB-BMP (Basic Metabolic Panel-BMET) [80048-METABOL] 4)  TLB-CBC Platelet - w/Differential [85025-CBCD] 5)  TLB-PSA (Prostate Specific Antigen) [09811-BJY] 6)  TLB-Hepatic/Liver Function Pnl [80076-HEPATIC] 7)  Specimen Handling [99000] 8)  Est. Patient age 60-64 [99396]   Immunization History:  Influenza Immunization History:    Influenza:  historical (10/17/2010)   Immunization History:  Influenza Immunization History:    Influenza:  Historical (10/17/2010)   Risk Factors:  Tobacco use:  quit    Year quit:  2005 Alcohol use:  yes    Type:  beer    Drinks per day:  1 Exercise:  yes    Type:  active @ work

## 2011-01-30 NOTE — Progress Notes (Signed)
Summary: refill  Phone Note Refill Request Message from:  Fax from Pharmacy on December 01, 2010 4:02 PM  Refills Requested: Medication #1:  VIAGRA 50 MG TABS UAD [facility] - fax [phone]  Initial call taken by: Okey Regal [place],  December 01, 2010 4:03 PM    New/Updated Medications: VIAGRA 50 MG TABS (SILDENAFIL CITRATE) 1 by mouth once daily as needed Prescriptions: VIAGRA 50 MG TABS (SILDENAFIL CITRATE) 1 by mouth once daily as needed  #10 x 3   Entered and Authorized by:   Nolon Rod. Paz MD   Signed by:   Nolon Rod. Paz MD on 12/01/2010   Method used:   Electronically to        [facility] (retail)       [address]       [place], [place]  [address]       Ph: [phone]       Fax: [phone]   RxID:   [id number]

## 2011-01-30 NOTE — Progress Notes (Signed)
Summary: lab results  Phone Note Outgoing Call   Summary of Call: advise patient: -PSA increased, patient was asymptomatic, DRE essentially normal. mild prostatitis?  Plan: Cipro 500 mg one p.o. b.i.d. for 10 days - potassium  slight increase, mail a  low potassium diet - his cholesterol is elevated compared to previous years, instead of changing his medication, I advise  him to focus on eating healthier and maybe exercise more - return to the office in 4 weeks for re-evaluation Jose E. Paz MD  November 21, 2010 9:18 AM   Follow-up for Phone Call        Discussed all with pt, will mail pt a low K+ diet.  Follow-up by: Army Fossa CMA,  November 21, 2010 9:31 AM    New/Updated Medications: ZOCOR 20 MG  TABS (SIMVASTATIN) Take 1 tablet by mouth every morning HYDROCHLOROTHIAZIDE 12.5 MG  CAPS (HYDROCHLOROTHIAZIDE) Take 1 capsule by mouth once a day CIPRO 500 MG TABS (CIPROFLOXACIN HCL) 1 p.o. b.i.d. for 10 days Prescriptions: ZESTRIL 40 MG  TABS (LISINOPRIL) Take 1 tablet by mouth every morning  #100 x 3   Entered by:   Army Fossa CMA   Authorized by:   Nolon Rod. Paz MD   Signed by:   Army Fossa CMA on 11/21/2010   Method used:   Electronically to        [facility] (retail)       [address]       [place], [place]  [address]       Ph: [phone]       Fax: [phone]   RxID:   [id number] HYDROCHLOROTHIAZIDE 12.5 MG  CAPS (HYDROCHLOROTHIAZIDE) Take 1 capsule by mouth once a day  #100 x 3   Entered by:   Army Fossa CMA   Authorized by:   Nolon Rod. Paz MD   Signed by:   Army Fossa CMA on 11/21/2010   Method used:   Electronically to        [facility] (retail)       [address]       [place], [place]  [address]       Ph: [phone]       Fax: [phone]   RxID:   [id number] ZOCOR 20 MG  TABS (SIMVASTATIN) Take 1 tablet by mouth every morning  #100 x 3   Entered by:    Army Fossa CMA   Authorized by:   Nolon Rod. Paz MD   Signed by:   Army Fossa CMA on 11/21/2010   Method used:   Electronically to        [facility] (retail)       [address]       [place], [place]  [address]       Ph: [phone]       Fax: [phone]   RxID:   [id number] CIPRO 500 MG TABS (CIPROFLOXACIN HCL) 1 p.o. b.i.d. for 10 days  #20 x 0   Entered by:   Army Fossa CMA   Authorized by:   Nolon Rod. Paz MD   Signed by:   Army Fossa CMA on 11/21/2010   Method used:   Electronically to        [facility] (retail)  [address]       [place], [place]  [address]       Ph: [phone]       Fax: [phone]   RxID:   [id number]

## 2011-02-01 NOTE — Assessment & Plan Note (Signed)
Summary: bp check and labs/kb   Nurse Visit   Vital Signs:  Patient profile:   60 year old male BP sitting:   114 / 62  (left arm) Cuff size:   regular  Vitals Entered By: Lucious Groves CMA (January 19, 2011 3:34 PM) CC: BP check and labs./kb Comments Patient was advised that this reading is fine and was sent to the lab. /kb   Impression & Recommendations:  Problem # 1:  HYPERTENSION (ICD-401.9) meds adjusted recently d/t hyperkalemia BP great His updated medication list for this problem includes:    Hydrochlorothiazide 25 Mg Tabs (Hydrochlorothiazide) .[name]... 1 by mouth daily.    Zestril 20 Mg Tabs (Lisinopril) .[name]... 1 by mouth daily.  BP today: 114/62 Prior BP: 126/86 (12/18/2010)  Labs Reviewed: K+: 5.4 (12/18/2010) Creat: : 1.0 (11/20/2010)   Chol: 241 (11/20/2010)   HDL: 82.30 (11/20/2010)   LDL: DEL (11/12/2008)   TG: 66.0 (11/20/2010)  Complete Medication List: 1)  Zocor 20 Mg Tabs (Simvastatin) .... Take 1 tablet by mouth every morning 2)  Hydrochlorothiazide 25 Mg Tabs (Hydrochlorothiazide) .[name].. 1 by mouth daily. 3)  Zestril 20 Mg Tabs (Lisinopril) .[name].. 1 by mouth daily. 4)  Adprin B 325 Mg Tabs (Aspirin buf(cacarb-mgcarb-mgo)) .... Otc 5)  Viagra 50 Mg Tabs (Sildenafil citrate) .[name].. 1 by mouth once daily as needed  Other Orders: Specimen Handling (16109) Venipuncture (60454)   Allergies: No Known Drug Allergies  Orders Added: 1)  Specimen Handling [99000] 2)  Venipuncture [09811] 3)  Est. Patient Level I [91478]

## 2011-02-01 NOTE — Letter (Signed)
Summary: Pre Visit Letter Revised  [facility]  [address] [place], [place] [address]   Phone: [phone]  Fax: [phone]        12/20/2010 MRN: [id number]  Brett King [address] [place], [place]  [address]             Procedure Date:  01-24-11 11:30am           Dr Marina Goodell   Welcome to the Gastroenterology Division at [facility].    You are scheduled to see a nurse for your pre-procedure visit on 01-10-2011 at 10:30am on the 3rd floor at [facility], [address]  We ask that you try to arrive at our office 15 minutes prior to your appointment time to allow for check-in.  Please take a minute to review the attached form.  If you answer "Yes" to one or more of the questions on the first page, we ask that you call the person listed at your earliest opportunity.  If you answer "No" to all of the questions, please complete the rest of the form and bring it to your appointment.    Your nurse visit will consist of discussing your medical and surgical history, your immediate family medical history, and your medications.   If you are unable to list all of your medications on the form, please bring the medication bottles to your appointment and we will list them.  We will need to be aware of both prescribed and over the counter drugs.  We will need to know exact dosage information as well.    Please be prepared to read and sign documents such as consent forms, a financial agreement, and acknowledgement forms.  If necessary, and with your consent, a friend or relative is welcome to sit-in on the nurse visit with you.  Please bring your insurance card so that we may make a copy of it.  If your insurance requires a referral to see a specialist, please bring your referral form from your primary care physician.  No co-pay is required for this nurse visit.     If you cannot keep your appointment, please call [phone] to cancel or reschedule prior to your  appointment date.  This allows [place] the opportunity to schedule an appointment for another patient in need of care.    Thank you for choosing [facility] for your medical needs.  We appreciate the opportunity to care for you.  Please visit [place] at our website  to learn more about our practice.  Sincerely, The Gastroenterology Division

## 2011-02-01 NOTE — Progress Notes (Signed)
Summary: Returned Call  Phone Note Call from Patient Call back at [facility] Phone [phone]   Caller: Patient Summary of Call: Patient called back and said someone called him talking super fast and he could not understand why she was calling. I reviewed chart documents several times and was unable to see where patient was called. I informed patient I will forward this to Duwayne Heck (Dr.Paz assistant)-? if she may have called but she is currently out of the office. Patient indicated he can be better reached on his cell [phone]   Shonna Chock CMA  January 18, 2011 4:42 PM     Additional Follow-up for Phone Call Additional follow up Details #2::    Spoke w/ pt and he is aware that he needs to have his BP check. He thinks that the high BP is coming from his job. Army Fossa CMA  January 19, 2011 8:03 AM

## 2011-02-01 NOTE — Assessment & Plan Note (Signed)
Summary: rto 1 month/cbs   Vital Signs:  Patient profile:   60 year old male Weight:      186 pounds Pulse rate:   98 / minute Pulse rhythm:   regular BP sitting:   126 / 86  (left arm) Cuff size:   regular  Vitals Entered By: Army Fossa CMA (December 18, 2010 9:12 AM) CC: 1 month f/u- fasting  Comments discuss potassium [facility]    History of Present Illness: one month followup after his physical exam PSA was elevated, he was prescribed 10 days of Cipro, needs a PSA check His potassium was also slightly elevated, and he is following a low potassium diet we also contacted GI, he is due for a colonoscopy with Dr.  Marina Goodell. Needs a referral   Review of systems Denies any dysuria or difficulty urinating, no blood in the urine. Denies any nausea, vomiting, blood in  stools  Current Medications (verified): 1)  Zocor 20 Mg  Tabs (Simvastatin) .... Take 1 Tablet By Mouth Every Morning 2)  Hydrochlorothiazide 12.5 Mg  Caps (Hydrochlorothiazide) .... Take 1 Capsule By Mouth Once A Day 3)  Zestril 40 Mg  Tabs (Lisinopril) .... Take 1 Tablet By Mouth Every Morning 4)  Adprin B 325 Mg  Tabs (Aspirin Buf(Cacarb-Mgcarb-Mgo)) .... Otc 5)  Viagra 50 Mg Tabs (Sildenafil Citrate) .[name].. 1 By Mouth Once Daily As Needed  Allergies (verified): No Known Drug Allergies  Past History:  Past Medical History: HYPERLIPIDEMIA  HYPERTENSION  h/o DEPRESSION when lost wife   Past Surgical History: Reviewed history from 11/20/2010 and no changes required. POLYPECTOMY  GANGLION CYST REMOVAL , HX OF   Cholecystectomy 2008  Social History: "Rick" Single, lost wife in 1996 2 kids  son lives w/ patient  Smoked 1 ppd from 27 to 46 y/o , then quit  Alcohol use--- beer x2 most nights  diet-- eats a variety of foods and vegetables  Drug use-no Regular exercise-yes, very active at work  Occupation: Location manager, Building control surveyor.  Physical Exam  General:  alert and well-developed.     Rectal:  external hemorrhoids noted. Normal sphincter tone. No rectal masses or tenderness. Brown stools  Prostate:  Prostate gland firm and smooth, no enlargement, nodularity, tenderness, mass, asymmetry or induration. Extremities:  no lower extremity edema   Impression & Recommendations:  Problem # 1:  PSA, INCREASED (ICD-790.93) asymptomatic patient with increased PSA.  Review of systems negative, status post cipro. recheck PSA  Orders: Venipuncture (04540) TLB-PSA (Prostate Specific Antigen) (84153-PSA) Specimen Handling (98119)  Problem # 2:  HYPERTENSION (ICD-401.9) mild hyperkalemia, on a low potassium diet. Labs His updated medication list for this problem includes:    Hydrochlorothiazide 12.5 Mg Caps (Hydrochlorothiazide) .[name]... Take 1 capsule by mouth once a day    Zestril 40 Mg Tabs (Lisinopril) .[name]... Take 1 tablet by mouth every morning  BP today: 126/86 Prior BP: 136/82 (11/20/2010)  Labs Reviewed: K+: 5.2 (11/20/2010) Creat: : 1.0 (11/20/2010)   Chol: 241 (11/20/2010)   HDL: 82.30 (11/20/2010)   LDL: DEL (11/12/2008)   TG: 66.0 (11/20/2010)  Orders: TLB-Potassium (K+) (84132-K) Specimen Handling (14782)  Problem # 3:  HEALTH SCREENING (ICD-V70.0) due for a colonoscopy, refer to GI Marina Goodell)  Orders: Gastroenterology Referral (GI)  Complete Medication List: 1)  Zocor 20 Mg Tabs (Simvastatin) .... Take 1 tablet by mouth every morning 2)  Hydrochlorothiazide 12.5 Mg Caps (Hydrochlorothiazide) .... Take 1 capsule by mouth once a day 3)  Zestril 40 Mg Tabs (  Lisinopril) .... Take 1 tablet by mouth every morning 4)  Adprin B 325 Mg Tabs (Aspirin buf(cacarb-mgcarb-mgo)) .... Otc 5)  Viagra 50 Mg Tabs (Sildenafil citrate) .[name].. 1 by mouth once daily as needed  Patient Instructions: 1)  Please schedule a follow-up appointment in 6 months .    Orders Added: 1)  Venipuncture [36415] 2)  TLB-Potassium (K+) [84132-K] 3)  TLB-PSA (Prostate Specific Antigen)  [21308-MVH] 4)  Specimen Handling [99000] 5)  Gastroenterology Referral [GI] 6)  Est. Patient Level III [84696]

## 2011-02-15 ENCOUNTER — Encounter (INDEPENDENT_AMBULATORY_CARE_PROVIDER_SITE_OTHER): Payer: Self-pay | Admitting: *Deleted

## 2011-02-16 ENCOUNTER — Encounter: Payer: Self-pay | Admitting: Internal Medicine

## 2011-02-21 NOTE — Letter (Signed)
Summary: Moviprep Instructions  [facility]  [address]   [place], [place] [address]   Phone: [phone]  Fax: [phone]       ELZIE KNISLEY    09/04/1951    MRN: [id number]        Procedure Day Dorna Bloom: Friday, 03-02-11     Arrival Time: 7:30 a.m.     Procedure Time: 8:30 a.m.     Location of Procedure:                    x  [facility] (4th Floor)                        PREPARATION FOR COLONOSCOPY WITH MOVIPREP   Starting 5 days prior to your procedure 02-25-11 do not eat nuts, seeds, popcorn, corn, beans, peas,  salads, or any raw vegetables.  Do not take any fiber supplements (e.g. Metamucil, Citrucel, and Benefiber).  THE DAY BEFORE YOUR PROCEDURE         DATE: 03-01-11  DAY: Friday  1.  Drink clear liquids the entire day-NO SOLID FOOD  2.  Do not drink anything colored red or purple.  Avoid juices with pulp.  No orange juice.  3.  Drink at least 64 oz. (8 glasses) of fluid/clear liquids during the day to prevent dehydration and help the prep work efficiently.  CLEAR LIQUIDS INCLUDE: Water Jello Ice Popsicles Tea (sugar ok, no milk/cream) Powdered fruit flavored drinks Coffee (sugar ok, no milk/cream) Gatorade Juice: apple, white grape, white cranberry  Lemonade Clear bullion, consomm, broth Carbonated beverages (any kind) Strained chicken noodle soup Hard Candy                             4.  In the morning, mix first dose of MoviPrep solution:    Empty 1 Pouch A and 1 Pouch B into the disposable container    Add lukewarm drinking water to the top line of the container. Mix to dissolve    Refrigerate (mixed solution should be used within 24 hrs)  5.  Begin drinking the prep at 5:00 p.m. The MoviPrep container is divided by 4 marks.   Every 15 minutes drink the solution down to the next mark (approximately 8 oz) until the full liter is complete.   6.  Follow completed prep with 16 oz of clear liquid of your choice (Nothing red  or purple).  Continue to drink clear liquids until bedtime.  7.  Before going to bed, mix second dose of MoviPrep solution:    Empty 1 Pouch A and 1 Pouch B into the disposable container    Add lukewarm drinking water to the top line of the container. Mix to dissolve    Refrigerate  THE DAY OF YOUR PROCEDURE      DATE: 03-02-11  DAY: Friday  Beginning at 3:30 a.m. (5 hours before procedure):         1. Every 15 minutes, drink the solution down to the next mark (approx 8 oz) until the full liter is complete.  2. Follow completed prep with 16 oz. of clear liquid of your choice.    3. You may drink clear liquids until 6:30 a.m. (2 HOURS BEFORE PROCEDURE).   MEDICATION INSTRUCTIONS  Unless otherwise instructed, you should take regular prescription medications with a small sip of water   as early as possible the morning of  your procedure.  Additional medication instructions: DO NOT TAKE HYDROCHLORITHIAZIDE ON DAY OF PROCEDURE.         OTHER INSTRUCTIONS  You will need a responsible adult at least 60 years of age to accompany you and drive you home.   This person must remain in the waiting room during your procedure.  Wear loose fitting clothing that is easily removed.  Leave jewelry and other valuables at home.  However, you may wish to bring a book to read or  an iPod/MP3 player to listen to music as you wait for your procedure to start.  Remove all body piercing jewelry and leave at home.  Total time from sign-in until discharge is approximately 2-3 hours.  You should go home directly after your procedure and rest.  You can resume normal activities the  day after your procedure.  The day of your procedure you should not:   Drive   Make legal decisions   Operate machinery   Drink alcohol   Return to work  You will receive specific instructions about eating, activities and medications before you leave.    The above instructions have been reviewed and  explained to me by   Durwin Glaze RN  February 16, 2011 3:01 PM    I fully understand and can verbalize these instructions _____________________________ Date _________

## 2011-02-21 NOTE — Miscellaneous (Signed)
Summary: [facility]  Clinical Lists Changes  Medications: Added new medication of MOVIPREP 100 GM  SOLR (PEG-KCL-NACL-NASULF-NA ASC-C) As per prep instructions. - Signed Rx of MOVIPREP 100 GM  SOLR (PEG-KCL-NACL-NASULF-NA ASC-C) As per prep instructions.;  #1 x 0;  Signed;  Entered by: Durwin Glaze RN;  Authorized by: Hilarie Fredrickson MD;  Method used: Electronically to [facility]  [address] [id number] [address], [place], [place]  [address], Ph: [phone] or [phone], Fax: [phone] Observations: Added new observation of NKA: T (02/16/2011 14:40)    Prescriptions: MOVIPREP 100 GM  SOLR (PEG-KCL-NACL-NASULF-NA ASC-C) As per prep instructions.  #1 x 0   Entered by:   Durwin Glaze RN   Authorized by:   Hilarie Fredrickson MD   Signed by:   Durwin Glaze RN on 02/16/2011   Method used:   [facility]ronically to        [facility]  [address] [id number] ([facility]       [address]       [place], [place]  [address]       Ph: [phone] or [phone]       Fax: [phone]   RxID:   [id number]

## 2011-03-02 ENCOUNTER — Other Ambulatory Visit (AMBULATORY_SURGERY_CENTER): Payer: BC Managed Care – PPO | Admitting: Internal Medicine

## 2011-03-02 ENCOUNTER — Other Ambulatory Visit: Payer: Self-pay | Admitting: Internal Medicine

## 2011-03-02 DIAGNOSIS — Z1211 Encounter for screening for malignant neoplasm of colon: Secondary | ICD-10-CM

## 2011-03-02 DIAGNOSIS — K5730 Diverticulosis of large intestine without perforation or abscess without bleeding: Secondary | ICD-10-CM

## 2011-03-02 DIAGNOSIS — Z800 Family history of malignant neoplasm of digestive organs: Secondary | ICD-10-CM

## 2011-03-02 DIAGNOSIS — D126 Benign neoplasm of colon, unspecified: Secondary | ICD-10-CM

## 2011-03-02 DIAGNOSIS — Z86010 Personal history of colonic polyps: Secondary | ICD-10-CM

## 2011-03-06 ENCOUNTER — Encounter: Payer: Self-pay | Admitting: Internal Medicine

## 2011-03-08 NOTE — Procedures (Addendum)
Summary: Colonoscopy  Patient: Brett King Note: All result statuses are Final unless otherwise noted.  Tests: (1) Colonoscopy (COL)   COL Colonoscopy           DONE     [facility]     [address]     [place], [place]  [address]          COLONOSCOPY PROCEDURE REPORT          PATIENT:  Brett, King  MR#:  [id number]     BIRTHDATE:  02-14-1951, 60 yrs. old  GENDER:  male     ENDOSCOPIST:  Wilhemina Bonito. Eda Keys, MD     REF. BY:  Surveillance Program Recall,     PROCEDURE DATE:  03/02/2011     PROCEDURE:  Colonoscopy with snare polypectomy x 6     ASA CLASS:  Class II     INDICATIONS:  history of pre-cancerous (adenomatous) colon polyps,     family history of colon cancer, surveillance and high-risk     screening ; index 1998 w/ multiple TAs; f/u 2000; Father and GP w/     CRC (overdue)     MEDICATIONS:   Fentanyl 75 mcg IV, Versed 10 mg IV          DESCRIPTION OF PROCEDURE:   After the risks benefits and     alternatives of the procedure were thoroughly explained, informed     consent was obtained.  Digital rectal exam was performed and     revealed no abnormalities.   The LB 180AL [id number] endoscope was     introduced through the anus and advanced to the cecum, which was     identified by both the appendix and ileocecal valve, without     limitations.Time to cecum = 1:34 min. The quality of the prep was     good, using MiraLax.  The instrument was then slowly withdrawn     (time = 18:11 min) as the colon was fully examined.     <<PROCEDUREIMAGES>>          FINDINGS:  There were FIVE polyps, all < 6mm,  identified and     removed from the ascending colon (3), transverse colon and sigmoid     colon. Polyps were snared without cautery. Retrieval was     successful.   Also, a 14mm  sessile polyp was found in the     proximal transverse colon. Polyp was snared, then cauterized with     monopolar cautery. Retrieval was successful.  Mild diverticulosis     was found in the  sigmoid colon.   Retroflexed views in the rectum     revealed internal hemorrhoids.    The scope was then withdrawn     from the patient and the procedure completed.          COMPLICATIONS:  None     ENDOSCOPIC IMPRESSION:     1) Polyps, multiple ascending colon to sigmoid colon - removed     2) Sessile polyp in the proximal transverse colon - removed     3) Mild diverticulosis in the sigmoid colon     4) Internal hemorrhoids          RECOMMENDATIONS:     1) Repeat Colonoscopy in 3 years.          ______________________________     Wilhemina Bonito. Eda Keys, MD          CC:  Willow Ora, MD;  The Patient          n.     eSIGNED:   Celesta Funderburk N. Eda Keys at 03/02/2011 09:33 AM          Juanna Cao, [id number]  Note: An exclamation mark (!) indicates a result that was not dispersed into the flowsheet. Document Creation Date: 03/02/2011 9:33 AM _______________________________________________________________________  (1) Order result status: Final Collection or observation date-time: 03/02/2011 09:25 Requested date-time:  Receipt date-time:  Reported date-time:  Referring Physician:   Ordering Physician: Fransico Setters [phone]) Specimen Source:  Source: Launa Grill Order Number: [id number] Lab site:   Appended Document: Colonoscopy recall 3 yrs     Procedures Next Due Date:    Colonoscopy: 02/2014

## 2011-03-13 NOTE — Letter (Addendum)
Summary: Patient Notice- Polyp Results  [facility]  [address] [place], [place] [address]   Phone: [phone]  Fax: [phone]        March 06, 2011 MRN: [id number]    Brett King [address] [place], [place]  [address]    Dear [name],  I am pleased to inform you that the colon polyp(s) removed during your recent colonoscopy was (were) found to be benign (no cancer detected) upon pathologic examination.  I recommend you have a repeat colonoscopy examination in 3 years to look for recurrent polyps, as having colon polyps increases your risk for having recurrent polyps or even colon cancer in the future.  Should you develop new or worsening symptoms of abdominal pain, bowel habit changes or bleeding from the rectum or bowels, please schedule an evaluation with either your primary care physician or with me.  Additional information/recommendations:  __ No further action with gastroenterology is needed at this time. Please      follow-up with your primary care physician for your other healthcare      needs.   Please call us if you are having persistent problems or have questions about your condition that have not been fully answered at this time.  Sincerely,  Hilarie Fredrickson MD  This letter has been electronically signed by your physician.  Appended Document: Patient Notice- Polyp Results letter mailed

## 2011-05-18 NOTE — Op Note (Signed)
NAMETAVI, HOOGENDOORN NO.:  [id number]   MEDICAL RECORD NO.:  [id number]          PATIENT TYPE:  AMB   LOCATION:  DAY                          FACILITY:  [facility]   PHYSICIAN:  Alfonse Ras, MD   DATE OF BIRTH:  June 02, 1951   DATE OF PROCEDURE:  02/05/2007  DATE OF DISCHARGE:  01/21/2007                               OPERATIVE REPORT   This is a repeat dictation.   PREOPERATIVE DIAGNOSIS:  Symptomatic cholelithiasis   POSTOPERATIVE DIAGNOSIS:  Symptomatic cholelithiasis   PROCEDURE:  Laparoscopic cholecystectomy.   SURGEON:  Dr. Baruch Merl.   ASSISTANT:  Dr. Thornton Dales.   ANESTHESIA:  General.   DESCRIPTION OF PROCEDURE:  The patient was taken to the operating room  and placed in a supine position and after adequate general anesthesia  was induced using endotracheal tube, the abdomen was prepped and draped  in a normal sterile fashion. Using a transverse incision, I dissected  down the fascia. The fascia was opened vertically.  An #0 Vicryl  pursestring suture was placed around the fascial defect and Hasson  trocar was placed in the abdomen. The abdomen was insufflated with  carbon dioxide.  A subxiphoid 11-mm trocar was placed under direct  vision and two 5 mm trocars were placed in the right abdomen.  The  gallbladder was identified and retracted cephalad.  Dissection at the  neck of the gallbladder visualized the cystic duct with a critical view  easily.  The anatomy seemed very straight forward at the junction with  the gallbladder and the common duct.  It was triply clipped and divided.  The cystic artery was then identified in a similar fashion, a  critical  view was obtained, triply clipped and divided. The gallbladder was taken  off the gallbladder bed using Bovie electrocautery and removed through  the umbilical port.  Pneumoperitoneum was released. After adequate  hemostasis was ensured in the right upper quadrant, the infraumbilical  fascial defect was closed with the #0 Vicryl pursestring suture.  Skin  incisions were closed with subcuticular 4-0 Monocryl.  Steri-Strips and  sterile dressings were applied.  The patient tolerated the procedure  well and went to PACU in good condition.      Alfonse Ras, MD  Electronically Signed     KRE/MEDQ  D:  02/07/2007  T:  02/07/2007  Job:  [id number]

## 2011-07-30 ENCOUNTER — Other Ambulatory Visit: Payer: Self-pay | Admitting: Internal Medicine

## 2011-08-31 ENCOUNTER — Telehealth: Payer: Self-pay | Admitting: Internal Medicine

## 2011-08-31 NOTE — Telephone Encounter (Signed)
Waiting on approval from Dr Tawanna Cooler.

## 2011-08-31 NOTE — Telephone Encounter (Signed)
Ok with me 

## 2011-08-31 NOTE — Telephone Encounter (Signed)
Pt called and is req to change pcps from Dr Drue Novel to Dr Tawanna Cooler because [facility] is closer to pts home. Pls advise if ok. Pt would like to get a cpx in November.

## 2011-09-10 NOTE — Telephone Encounter (Signed)
ok 

## 2011-09-10 NOTE — Telephone Encounter (Signed)
Brett King, I got this from [place]. Dr. Drue Novel & Dr. Tawanna Cooler both said "ok". I guess you're cleared to call the pt & schedule an appy

## 2011-09-10 NOTE — Telephone Encounter (Signed)
lft vm for pt to call back re: change of pcp approval.

## 2011-09-10 NOTE — Telephone Encounter (Signed)
Pt called back and has sch a pt to est/fasting cpx with Dr Tawanna Cooler on 11/19/11.

## 2011-09-20 ENCOUNTER — Other Ambulatory Visit: Payer: Self-pay | Admitting: Internal Medicine

## 2011-09-20 MED ORDER — HYDROCHLOROTHIAZIDE 12.5 MG PO CAPS: 12.5000 mg | ORAL_CAPSULE | ORAL | Status: DC

## 2011-09-20 NOTE — Telephone Encounter (Signed)
Called pt was not at home left smg with son rx was sent back to [facility] & will need appt in 3 months for additional refills.Marland KitchenMarland Kitchen9/20/12@9 :55am/LMB

## 2011-09-20 NOTE — Telephone Encounter (Signed)
Ok 3 month supply, needs OV before next RF

## 2011-09-20 NOTE — Telephone Encounter (Signed)
Pt is at the pharmacy requesting refills on HCTZ. Last ov 12/10/10. Is it ok to refill med?

## 2011-11-19 ENCOUNTER — Ambulatory Visit (INDEPENDENT_AMBULATORY_CARE_PROVIDER_SITE_OTHER): Payer: BC Managed Care – PPO | Admitting: Family Medicine

## 2011-11-19 ENCOUNTER — Encounter: Payer: Self-pay | Admitting: Family Medicine

## 2011-11-19 DIAGNOSIS — I10 Essential (primary) hypertension: Secondary | ICD-10-CM

## 2011-11-19 DIAGNOSIS — R972 Elevated prostate specific antigen [PSA]: Secondary | ICD-10-CM

## 2011-11-19 DIAGNOSIS — N529 Male erectile dysfunction, unspecified: Secondary | ICD-10-CM

## 2011-11-19 DIAGNOSIS — Z23 Encounter for immunization: Secondary | ICD-10-CM

## 2011-11-19 DIAGNOSIS — E785 Hyperlipidemia, unspecified: Secondary | ICD-10-CM

## 2011-11-19 LAB — PSA: PSA: 0.54 ng/mL (ref 0.10–4.00)

## 2011-11-19 LAB — CBC WITH DIFFERENTIAL/PLATELET
Basophils Absolute: 0 10*3/uL (ref 0.0–0.1)
Basophils Relative: 0.6 % (ref 0.0–3.0)
Eosinophils Absolute: 0.1 10*3/uL (ref 0.0–0.7)
Eosinophils Relative: 1.6 % (ref 0.0–5.0)
HCT: 43.6 % (ref 39.0–52.0)
Hemoglobin: 14.7 g/dL (ref 13.0–17.0)
Lymphocytes Relative: 14.6 % (ref 12.0–46.0)
Lymphs Abs: 0.8 10*3/uL (ref 0.7–4.0)
MCHC: 33.8 g/dL (ref 30.0–36.0)
MCV: 94.6 fl (ref 78.0–100.0)
Monocytes Absolute: 0.4 10*3/uL (ref 0.1–1.0)
Monocytes Relative: 7.3 % (ref 3.0–12.0)
Neutro Abs: 4.3 10*3/uL (ref 1.4–7.7)
Neutrophils Relative %: 75.9 % (ref 43.0–77.0)
Platelets: 250 10*3/uL (ref 150.0–400.0)
RBC: 4.61 Mil/uL (ref 4.22–5.81)
RDW: 13.3 % (ref 11.5–14.6)
WBC: 5.7 10*3/uL (ref 4.5–10.5)

## 2011-11-19 LAB — BASIC METABOLIC PANEL
BUN: 16 mg/dL (ref 6–23)
CO2: 25 mEq/L (ref 19–32)
Calcium: 9.7 mg/dL (ref 8.4–10.5)
Chloride: 103 mEq/L (ref 96–112)
Creatinine, Ser: 0.9 mg/dL (ref 0.4–1.5)
GFR: 91.25 mL/min (ref >60.00)
Glucose, Bld: 110 mg/dL — ABNORMAL HIGH (ref 70–99)
Potassium: 5.1 mEq/L (ref 3.5–5.1)
Sodium: 137 mEq/L (ref 135–145)

## 2011-11-19 LAB — HEPATIC FUNCTION PANEL
ALT: 30 U/L (ref 0–53)
AST: 20 U/L (ref 0–37)
Albumin: 4.3 g/dL (ref 3.5–5.2)
Alkaline Phosphatase: 52 U/L (ref 39–117)
Bilirubin, Direct: 0.1 mg/dL (ref 0.0–0.3)
Total Bilirubin: 0.5 mg/dL (ref 0.3–1.2)
Total Protein: 7.3 g/dL (ref 6.0–8.3)

## 2011-11-19 LAB — POCT URINALYSIS DIPSTICK
Bilirubin, UA: NEGATIVE
Blood, UA: NEGATIVE
Glucose, UA: NEGATIVE
Ketones, UA: NEGATIVE
Leukocytes, UA: NEGATIVE
Nitrite, UA: NEGATIVE
Protein, UA: NEGATIVE
Spec Grav, UA: 1.015
Urobilinogen, UA: 0.2
pH, UA: 5.5

## 2011-11-19 LAB — TSH: TSH: 1.38 u[IU]/mL (ref 0.35–5.50)

## 2011-11-19 LAB — LIPID PANEL
Cholesterol: 235 mg/dL — ABNORMAL HIGH (ref 0–200)
HDL: 90.1 mg/dL (ref >39.00)
Total CHOL/HDL Ratio: 3
Triglycerides: 54 mg/dL (ref 0.0–149.0)
VLDL: 10.8 mg/dL (ref 0.0–40.0)

## 2011-11-19 LAB — LDL CHOLESTEROL, DIRECT: Direct LDL: 147.4 mg/dL

## 2011-11-19 MED ORDER — LISINOPRIL-HYDROCHLOROTHIAZIDE 20-12.5 MG PO TABS: 1.0000 | ORAL_TABLET | Freq: Every day | ORAL | Status: DC

## 2011-11-19 MED ORDER — SILDENAFIL CITRATE 100 MG PO TABS: 100.0000 mg | ORAL_TABLET | ORAL | Status: DC | PRN

## 2011-11-19 MED ORDER — SIMVASTATIN 20 MG PO TABS: 20.0000 mg | ORAL_TABLET | Freq: Every day | ORAL | Status: DC

## 2011-11-19 NOTE — Progress Notes (Signed)
  Subjective:    Patient ID: Brett King, male    DOB: 12-25-1951, 60 y.o.   MRN: [id number]  HPI Brett King is a 60 year old, widowed male, nonsmoker, who comes in today as a new patient having transferred from Dr. Drue King.......... For evaluation of hypertension, hyperlipidemia, and a history of increased PSA, and BPH.  He has always been in good health.  He's had no chronic health problems except for the above.  He had a cholecystectomy as an outpatient, otherwise he's never been hospitalized.  Does not drink or smoke.  Review of systems negative except for some osteoarthritis for which he takes Aleve one twice daily.  His father is 28 in good health.  Mother died of pneumonia.  No brothers no sisters.  He continues to work full-time.  He is widowed and a son and a daughter.  Vaccinations tetanus 2008, Pneumovax 2005, seasonal flu shot 2012.   Review of Systems  Constitutional: Negative.   HENT: Negative.   Eyes: Negative.   Respiratory: Negative.   Cardiovascular: Negative.   Gastrointestinal: Negative.   Genitourinary: Negative.   Musculoskeletal: Negative.   Skin: Negative.   Neurological: Negative.   Hematological: Negative.   Psychiatric/Behavioral: Negative.        Objective:   Physical Exam  Constitutional: He is oriented to person, place, and time. He appears well-developed and well-nourished.  HENT:  Head: Normocephalic and atraumatic.  Right Ear: External ear normal.  Left Ear: External ear normal.  Nose: Nose normal.  Mouth/Throat: Oropharynx is clear and moist.  Eyes: Conjunctivae and EOM are normal. Pupils are equal, round, and reactive to light.  Neck: Normal range of motion. Neck supple. No JVD present. No tracheal deviation present. No thyromegaly present.  Cardiovascular: Normal rate, regular rhythm, normal heart sounds and intact distal pulses.  Exam reveals no gallop and no friction rub.   No murmur heard. Pulmonary/Chest: Effort normal and breath sounds normal.  No stridor. No respiratory distress. He has no wheezes. He has no rales. He exhibits no tenderness.  Abdominal: Soft. Bowel sounds are normal. He exhibits no distension and no mass. There is no tenderness. There is no rebound and no guarding.  Genitourinary: Rectum normal, prostate normal and penis normal. Guaiac negative stool. No penile tenderness.  Musculoskeletal: Normal range of motion. He exhibits no edema and no tenderness.  Lymphadenopathy:    He has no cervical adenopathy.  Neurological: He is alert and oriented to person, place, and time. He has normal reflexes. No cranial nerve deficit. He exhibits normal muscle tone.  Skin: Skin is warm and dry. No rash noted. No erythema. No pallor.  Psychiatric: He has a normal mood and affect. His behavior is normal. Judgment and thought content normal.          Assessment & Plan:  Healthy male.  History of hypertension.  Continue hydrochlorothiazide 25 mg daily and lisinopril 20 daily, but combining one pill.  Hyperlipidemia.  Continue simvastatin 20 daily, and an aspirin tablet.  Check labs.  History of erectile dysfunction nigra p.r.n.  History of elevated PSA check PSA.  Return in one year, sooner if any problems.  He

## 2011-11-19 NOTE — Patient Instructions (Signed)
Take 20 mg of simvastatin and aspirin tablet daily at bedtime.  The blood pressure medicine, and Zestoretic, one tablet daily,,,,,,,,,,,, this is a combination of the diuretic, and the lisinopril, in one pill  Check your BP daily at home for 4 weeks goal.........[name] 135/85 or less.......... Call if not, at goal........[name] If it is all within just checked her blood pressure weekly at home to be sure it stays normal.  Follow-up in one year, sooner if any problem

## 2012-05-15 ENCOUNTER — Encounter: Payer: Self-pay | Admitting: Internal Medicine

## 2012-11-14 ENCOUNTER — Other Ambulatory Visit (INDEPENDENT_AMBULATORY_CARE_PROVIDER_SITE_OTHER): Payer: BC Managed Care – PPO

## 2012-11-14 DIAGNOSIS — Z0000 Encounter for general adult medical examination without abnormal findings: Secondary | ICD-10-CM

## 2012-11-14 DIAGNOSIS — Z125 Encounter for screening for malignant neoplasm of prostate: Secondary | ICD-10-CM

## 2012-11-14 LAB — HEPATIC FUNCTION PANEL
ALT: 30 U/L (ref 0–53)
AST: 24 U/L (ref 0–37)
Albumin: 4.4 g/dL (ref 3.5–5.2)
Alkaline Phosphatase: 57 U/L (ref 39–117)
Bilirubin, Direct: 0.2 mg/dL (ref 0.0–0.3)
Total Bilirubin: 0.7 mg/dL (ref 0.3–1.2)
Total Protein: 7.4 g/dL (ref 6.0–8.3)

## 2012-11-14 LAB — POCT URINALYSIS DIPSTICK
Blood, UA: NEGATIVE
Nitrite, UA: NEGATIVE
Urobilinogen, UA: 0.2
pH, UA: 6

## 2012-11-14 LAB — BASIC METABOLIC PANEL
BUN: 17 mg/dL (ref 6–23)
CO2: 27 mEq/L (ref 19–32)
Calcium: 10.3 mg/dL (ref 8.4–10.5)
Chloride: 104 mEq/L (ref 96–112)
Creatinine, Ser: 0.9 mg/dL (ref 0.4–1.5)
GFR: 93.34 mL/min (ref >60.00)
Glucose, Bld: 113 mg/dL — ABNORMAL HIGH (ref 70–99)
Potassium: 6 mEq/L — ABNORMAL HIGH (ref 3.5–5.1)
Sodium: 138 mEq/L (ref 135–145)

## 2012-11-14 LAB — CBC WITH DIFFERENTIAL/PLATELET
Basophils Relative: 0.8 % (ref 0.0–3.0)
Eosinophils Absolute: 0.1 10*3/uL (ref 0.0–0.7)
Eosinophils Relative: 2.4 % (ref 0.0–5.0)
Hemoglobin: 14.9 g/dL (ref 13.0–17.0)
Lymphs Abs: 0.9 10*3/uL (ref 0.7–4.0)
MCV: 92.4 fl (ref 78.0–100.0)
Monocytes Absolute: 0.5 10*3/uL (ref 0.1–1.0)
Monocytes Relative: 10.6 % (ref 3.0–12.0)
Neutro Abs: 3.3 10*3/uL (ref 1.4–7.7)
Neutrophils Relative %: 68.1 % (ref 43.0–77.0)
Platelets: 262 10*3/uL (ref 150.0–400.0)
RBC: 4.8 Mil/uL (ref 4.22–5.81)
RDW: 12.5 % (ref 11.5–14.6)
WBC: 4.8 10*3/uL (ref 4.5–10.5)

## 2012-11-14 LAB — LDL CHOLESTEROL, DIRECT: Direct LDL: 115.2 mg/dL

## 2012-11-14 LAB — TSH: TSH: 0.87 u[IU]/mL (ref 0.35–5.50)

## 2012-11-14 LAB — PSA: PSA: 0.66 ng/mL (ref 0.10–4.00)

## 2012-11-20 ENCOUNTER — Encounter: Payer: Self-pay | Admitting: Family Medicine

## 2012-11-20 ENCOUNTER — Ambulatory Visit (INDEPENDENT_AMBULATORY_CARE_PROVIDER_SITE_OTHER): Payer: BC Managed Care – PPO | Admitting: Family Medicine

## 2012-11-20 VITALS — BP 130/90 | Temp 98.3°F | Ht 71.25 in | Wt 185.0 lb

## 2012-11-20 DIAGNOSIS — Z136 Encounter for screening for cardiovascular disorders: Secondary | ICD-10-CM

## 2012-11-20 DIAGNOSIS — F528 Other sexual dysfunction not due to a substance or known physiological condition: Secondary | ICD-10-CM

## 2012-11-20 DIAGNOSIS — R972 Elevated prostate specific antigen [PSA]: Secondary | ICD-10-CM

## 2012-11-20 DIAGNOSIS — E785 Hyperlipidemia, unspecified: Secondary | ICD-10-CM

## 2012-11-20 DIAGNOSIS — Z0000 Encounter for general adult medical examination without abnormal findings: Secondary | ICD-10-CM

## 2012-11-20 DIAGNOSIS — Z23 Encounter for immunization: Secondary | ICD-10-CM

## 2012-11-20 DIAGNOSIS — I10 Essential (primary) hypertension: Secondary | ICD-10-CM

## 2012-11-20 MED ORDER — SILDENAFIL CITRATE 100 MG PO TABS: 100.0000 mg | ORAL_TABLET | ORAL | Status: DC | PRN

## 2012-11-20 MED ORDER — SIMVASTATIN 20 MG PO TABS: 20.0000 mg | ORAL_TABLET | Freq: Every day | ORAL | Status: DC

## 2012-11-20 MED ORDER — LISINOPRIL-HYDROCHLOROTHIAZIDE 20-12.5 MG PO TABS: 1.0000 | ORAL_TABLET | Freq: Every day | ORAL | Status: DC

## 2012-11-20 NOTE — Progress Notes (Signed)
  Subjective:    Patient ID: Brett King, male    DOB: [birth year], 61 y.o.   MRN: [id number]  HPI Brett King is a 61 year old married male nonsmoker who comes in today for physical examination because of a history of hypertension, hyperlipidemia, erectile dysfunction  His medications reviewed there've been no changes. He gets routine eye care, dental care, colonoscopy 2012 a couple polyps  Tetanus booster 2008, Pneumovax 2005 and today booster, seasonal flu shot 2013,     Review of Systems  Constitutional: Negative.   HENT: Negative.   Eyes: Negative.   Respiratory: Negative.   Cardiovascular: Negative.   Gastrointestinal: Negative.   Genitourinary: Negative.   Musculoskeletal: Negative.   Skin: Negative.   Neurological: Negative.   Hematological: Negative.   Psychiatric/Behavioral: Negative.        Objective:   Physical Exam  Constitutional: He is oriented to person, place, and time. He appears well-developed and well-nourished.  HENT:  Head: Normocephalic and atraumatic.  Right Ear: External ear normal.  Left Ear: External ear normal.  Nose: Nose normal.  Mouth/Throat: Oropharynx is clear and moist.  Eyes: Conjunctivae normal and EOM are normal. Pupils are equal, round, and reactive to light.  Neck: Normal range of motion. Neck supple. No JVD present. No tracheal deviation present. No thyromegaly present.  Cardiovascular: Normal rate, regular rhythm, normal heart sounds and intact distal pulses.  Exam reveals no gallop and no friction rub.   No murmur heard. Pulmonary/Chest: Effort normal and breath sounds normal. No stridor. No respiratory distress. He has no wheezes. He has no rales. He exhibits no tenderness.  Abdominal: Soft. Bowel sounds are normal. He exhibits no distension and no mass. There is no tenderness. There is no rebound and no guarding.  Genitourinary: Rectum normal, prostate normal and penis normal. Guaiac negative stool. No penile tenderness.    Musculoskeletal: Normal range of motion. He exhibits no edema and no tenderness.  Lymphadenopathy:    He has no cervical adenopathy.  Neurological: He is alert and oriented to person, place, and time. He has normal reflexes. No cranial nerve deficit. He exhibits normal muscle tone.  Skin: Skin is warm and dry. No rash noted. No erythema. No pallor.  Psychiatric: He has a normal mood and affect. His behavior is normal. Judgment and thought content normal.          Assessment & Plan:  Healthy male  Hypertension continue current medication  Erectile dysfunction continue current medication  Hyperlipidemia continue current medication followup one year sooner if any problems   NormalHistory of elevated PSA PSAs and exam

## 2012-11-20 NOTE — Patient Instructions (Signed)
Continue your current medications in good health habits  Return in one year sooner if any problem

## 2013-02-05 ENCOUNTER — Other Ambulatory Visit: Payer: Self-pay | Admitting: Internal Medicine

## 2013-02-11 ENCOUNTER — Telehealth: Payer: Self-pay | Admitting: *Deleted

## 2013-02-11 DIAGNOSIS — I10 Essential (primary) hypertension: Secondary | ICD-10-CM

## 2013-02-11 MED ORDER — LISINOPRIL-HYDROCHLOROTHIAZIDE 20-12.5 MG PO TABS: 1.0000 | ORAL_TABLET | Freq: Every day | ORAL | Status: DC

## 2013-02-11 NOTE — Telephone Encounter (Signed)
Patient was taking Lisinopril 20/12.5.

## 2013-04-03 ENCOUNTER — Telehealth: Payer: Self-pay | Admitting: Family Medicine

## 2013-04-03 DIAGNOSIS — E785 Hyperlipidemia, unspecified: Secondary | ICD-10-CM

## 2013-04-03 MED ORDER — SIMVASTATIN 20 MG PO TABS: 20.0000 mg | ORAL_TABLET | Freq: Every day | ORAL | Status: DC

## 2013-04-03 NOTE — Telephone Encounter (Signed)
Patient wants 90 day sent to [facility] because he is not going to be working at the same job soon.

## 2013-04-03 NOTE — Telephone Encounter (Signed)
Pt wants 90-day supply of simvastatin (ZOCOR) 20 MG tablet In order in to get a 90-day supply, needs new rx. Pt uses [facility] on [address]. However, I think he may need to use mail order to get 90 days, which [facility]. Here is the ph# he gave me - it's not Nicolette Bang: [phone]. May want to check.

## 2013-08-21 ENCOUNTER — Telehealth: Payer: Self-pay | Admitting: Family Medicine

## 2013-08-21 NOTE — Telephone Encounter (Signed)
PT called and stated that he is receiving a bill for DOS 11/20/12. He states that his insurance is not paying it because his EKG was coded with hypertension, ect. He states that he would like it resubmitted with a preventative code. Please assist.

## 2013-09-02 NOTE — Telephone Encounter (Signed)
Please advise patient this has been sent to billing office to be corrected today.  He should allow approximately 30 days for a response from insurance company.

## 2013-09-02 NOTE — Telephone Encounter (Signed)
Pt is aware.  

## 2013-10-27 ENCOUNTER — Other Ambulatory Visit (INDEPENDENT_AMBULATORY_CARE_PROVIDER_SITE_OTHER): Payer: No Typology Code available for payment source

## 2013-10-27 DIAGNOSIS — Z0000 Encounter for general adult medical examination without abnormal findings: Secondary | ICD-10-CM

## 2013-10-27 LAB — LIPID PANEL
HDL: 85.5 mg/dL (ref >39.00)
Triglycerides: 49 mg/dL (ref 0.0–149.0)
VLDL: 9.8 mg/dL (ref 0.0–40.0)

## 2013-10-27 LAB — HEPATIC FUNCTION PANEL
ALT: 34 U/L (ref 0–53)
AST: 25 U/L (ref 0–37)
Alkaline Phosphatase: 55 U/L (ref 39–117)
Bilirubin, Direct: 0.1 mg/dL (ref 0.0–0.3)
Total Bilirubin: 0.7 mg/dL (ref 0.3–1.2)
Total Protein: 7.4 g/dL (ref 6.0–8.3)

## 2013-10-27 LAB — CBC WITH DIFFERENTIAL/PLATELET
Basophils Relative: 0.4 % (ref 0.0–3.0)
Eosinophils Relative: 1 % (ref 0.0–5.0)
Lymphocytes Relative: 13.9 % (ref 12.0–46.0)
MCV: 92.1 fl (ref 78.0–100.0)
Monocytes Absolute: 0.6 10*3/uL (ref 0.1–1.0)
Monocytes Relative: 9.2 % (ref 3.0–12.0)
Neutrophils Relative %: 75.5 % (ref 43.0–77.0)
RBC: 4.78 Mil/uL (ref 4.22–5.81)
WBC: 6.8 10*3/uL (ref 4.5–10.5)

## 2013-10-27 LAB — POCT URINALYSIS DIPSTICK
Bilirubin, UA: NEGATIVE
Blood, UA: NEGATIVE
Glucose, UA: NEGATIVE
Ketones, UA: NEGATIVE
pH, UA: 5.5

## 2013-10-27 LAB — BASIC METABOLIC PANEL
Chloride: 105 mEq/L (ref 96–112)
Creatinine, Ser: 1.1 mg/dL (ref 0.4–1.5)
GFR: 74.26 mL/min (ref >60.00)

## 2013-10-27 LAB — LDL CHOLESTEROL, DIRECT: Direct LDL: 124.1 mg/dL

## 2013-11-03 ENCOUNTER — Encounter: Payer: BC Managed Care – PPO | Admitting: Family Medicine

## 2013-12-15 ENCOUNTER — Encounter: Payer: Self-pay | Admitting: Family Medicine

## 2013-12-15 ENCOUNTER — Ambulatory Visit (INDEPENDENT_AMBULATORY_CARE_PROVIDER_SITE_OTHER): Payer: No Typology Code available for payment source | Admitting: Family Medicine

## 2013-12-15 VITALS — BP 140/98 | Temp 97.9°F | Ht 72.0 in | Wt 192.0 lb

## 2013-12-15 DIAGNOSIS — I10 Essential (primary) hypertension: Secondary | ICD-10-CM

## 2013-12-15 DIAGNOSIS — E785 Hyperlipidemia, unspecified: Secondary | ICD-10-CM

## 2013-12-15 DIAGNOSIS — F528 Other sexual dysfunction not due to a substance or known physiological condition: Secondary | ICD-10-CM

## 2013-12-15 MED ORDER — SILDENAFIL CITRATE 100 MG PO TABS: 100.0000 mg | ORAL_TABLET | ORAL | Status: DC | PRN

## 2013-12-15 MED ORDER — LISINOPRIL-HYDROCHLOROTHIAZIDE 20-12.5 MG PO TABS: 1.0000 | ORAL_TABLET | Freq: Every day | ORAL | Status: DC

## 2013-12-15 MED ORDER — SIMVASTATIN 20 MG PO TABS: 20.0000 mg | ORAL_TABLET | Freq: Every day | ORAL | Status: DC

## 2013-12-15 NOTE — Progress Notes (Signed)
Pre visit review using our clinic review tool, if applicable. No additional management support is needed unless otherwise documented below in the visit note. 

## 2013-12-15 NOTE — Patient Instructions (Signed)
Continue current medications  [email or web address] is the website for your Viagra  Return in one year sooner if any problems

## 2013-12-15 NOTE — Progress Notes (Signed)
   Subjective:    Patient ID: Brett King, male    DOB: 1951-01-25, 62 y.o.   MRN: [id number]  HPI Brett King is a 62 year old married male nonsmoker who comes in today for general physical examination because of a history of hypertension hyperlipidemia erectile dysfunction  His med list reviewed new been no changes. His blood pressure home averages 135/85. BP today 140/98 but he just drove down  [place] a lot of traffic. He monitors his blood pressure carefully at home  He gets routine eye care, dental care, followup colonoscopy, vaccinations up-to-date, flu shot in October   Review of Systems  Constitutional: Negative.   HENT: Negative.   Eyes: Negative.   Respiratory: Negative.   Cardiovascular: Negative.   Gastrointestinal: Negative.   Genitourinary: Negative.   Musculoskeletal: Negative.   Skin: Negative.   Neurological: Negative.   Psychiatric/Behavioral: Negative.        Objective:   Physical Exam  Nursing note and vitals reviewed. Constitutional: He is oriented to person, place, and time. He appears well-developed and well-nourished.  HENT:  Head: Normocephalic and atraumatic.  Right Ear: External ear normal.  Left Ear: External ear normal.  Nose: Nose normal.  Mouth/Throat: Oropharynx is clear and moist.  Eyes: Conjunctivae and EOM are normal. Pupils are equal, round, and reactive to light.  Neck: Normal range of motion. Neck supple. No JVD present. No tracheal deviation present. No thyromegaly present.  Cardiovascular: Normal rate, regular rhythm, normal heart sounds and intact distal pulses.  Exam reveals no gallop and no friction rub.   No murmur heard. Pulmonary/Chest: Effort normal and breath sounds normal. No stridor. No respiratory distress. He has no wheezes. He has no rales. He exhibits no tenderness.  Abdominal: Soft. Bowel sounds are normal. He exhibits no distension and no mass. There is no tenderness. There is no rebound and no guarding.    Genitourinary: Rectum normal, prostate normal and penis normal. Guaiac negative stool. No penile tenderness.  Musculoskeletal: Normal range of motion. He exhibits no edema and no tenderness.  Lymphadenopathy:    He has no cervical adenopathy.  Neurological: He is alert and oriented to person, place, and time. He has normal reflexes. No cranial nerve deficit. He exhibits normal muscle tone.  Skin: Skin is warm and dry. No rash noted. No erythema. No pallor.  Psychiatric: He has a normal mood and affect. His behavior is normal. Judgment and thought content normal.          Assessment & Plan:  Healthy male   hypertension at goal continue current therapy  Hyperlipidemia goal continue current therapy  Erectile dysfunction continue Viagra when necessary

## 2014-04-08 ENCOUNTER — Encounter: Payer: Self-pay | Admitting: Internal Medicine

## 2014-09-01 ENCOUNTER — Encounter: Payer: Self-pay | Admitting: Internal Medicine

## 2014-12-14 ENCOUNTER — Other Ambulatory Visit: Payer: No Typology Code available for payment source

## 2014-12-16 ENCOUNTER — Other Ambulatory Visit (INDEPENDENT_AMBULATORY_CARE_PROVIDER_SITE_OTHER): Payer: No Typology Code available for payment source

## 2014-12-16 DIAGNOSIS — Z0000 Encounter for general adult medical examination without abnormal findings: Secondary | ICD-10-CM

## 2014-12-16 LAB — POCT URINALYSIS DIPSTICK
Bilirubin, UA: NEGATIVE
Blood, UA: NEGATIVE
GLUCOSE UA: NEGATIVE
Ketones, UA: NEGATIVE
Leukocytes, UA: NEGATIVE
Nitrite, UA: NEGATIVE
Protein, UA: NEGATIVE
Spec Grav, UA: 1.02
UROBILINOGEN UA: 0.2
pH, UA: 6

## 2014-12-16 LAB — COMPREHENSIVE METABOLIC PANEL
ALBUMIN: 4.4 g/dL (ref 3.5–5.2)
ALT: 33 U/L (ref 0–53)
AST: 25 U/L (ref 0–37)
Alkaline Phosphatase: 59 U/L (ref 39–117)
BUN: 13 mg/dL (ref 6–23)
CALCIUM: 10.3 mg/dL (ref 8.4–10.5)
CHLORIDE: 104 meq/L (ref 96–112)
CO2: 24 meq/L (ref 19–32)
Creatinine, Ser: 0.9 mg/dL (ref 0.4–1.5)
GFR: 85.92 mL/min (ref >60.00)
Glucose, Bld: 98 mg/dL (ref 70–99)
Potassium: 4.7 mEq/L (ref 3.5–5.1)
Sodium: 139 mEq/L (ref 135–145)
Total Bilirubin: 0.7 mg/dL (ref 0.2–1.2)
Total Protein: 7.1 g/dL (ref 6.0–8.3)

## 2014-12-16 LAB — LIPID PANEL
Cholesterol: 225 mg/dL — ABNORMAL HIGH (ref 0–200)
HDL: 67.8 mg/dL (ref >39.00)
LDL Cholesterol: 142 mg/dL — ABNORMAL HIGH (ref 0–99)
NONHDL: 157.2
TRIGLYCERIDES: 75 mg/dL (ref 0.0–149.0)
Total CHOL/HDL Ratio: 3
VLDL: 15 mg/dL (ref 0.0–40.0)

## 2014-12-16 LAB — CBC WITH DIFFERENTIAL/PLATELET
Basophils Absolute: 0 10*3/uL (ref 0.0–0.1)
Basophils Relative: 0.4 % (ref 0.0–3.0)
EOS ABS: 0.2 10*3/uL (ref 0.0–0.7)
EOS PCT: 2.3 % (ref 0.0–5.0)
HCT: 46.5 % (ref 39.0–52.0)
Hemoglobin: 15.7 g/dL (ref 13.0–17.0)
Lymphocytes Relative: 16.5 % (ref 12.0–46.0)
Lymphs Abs: 1.2 10*3/uL (ref 0.7–4.0)
MCHC: 33.8 g/dL (ref 30.0–36.0)
MCV: 92.3 fl (ref 78.0–100.0)
Monocytes Absolute: 0.6 10*3/uL (ref 0.1–1.0)
Monocytes Relative: 8.7 % (ref 3.0–12.0)
NEUTROS PCT: 72.1 % (ref 43.0–77.0)
Neutro Abs: 5.2 10*3/uL (ref 1.4–7.7)
Platelets: 247 10*3/uL (ref 150.0–400.0)
RBC: 5.03 Mil/uL (ref 4.22–5.81)
RDW: 13 % (ref 11.5–15.5)
WBC: 7.2 10*3/uL (ref 4.0–10.5)

## 2014-12-16 LAB — TSH: TSH: 1.91 u[IU]/mL (ref 0.35–4.50)

## 2014-12-16 LAB — PSA: PSA: 0.78 ng/mL (ref 0.10–4.00)

## 2014-12-21 ENCOUNTER — Encounter: Payer: No Typology Code available for payment source | Admitting: Family Medicine

## 2014-12-28 ENCOUNTER — Encounter: Payer: Self-pay | Admitting: Family Medicine

## 2014-12-28 ENCOUNTER — Ambulatory Visit (INDEPENDENT_AMBULATORY_CARE_PROVIDER_SITE_OTHER): Payer: No Typology Code available for payment source | Admitting: Family Medicine

## 2014-12-28 VITALS — BP 138/92 | HR 51 | Temp 98.2°F | Ht 72.0 in | Wt 192.8 lb

## 2014-12-28 DIAGNOSIS — H6122 Impacted cerumen, left ear: Secondary | ICD-10-CM

## 2014-12-28 NOTE — Patient Instructions (Addendum)
BEFORE YOU LEAVE: -flush ears  Have Dr. Sherren Mocha recheck your ear at your physical on Thursday

## 2014-12-28 NOTE — Progress Notes (Addendum)
  HPI:  Acute visit for:  1) "clogged ear" -started about 4 days ago -used qtip to clean ear and then woke up the next day and has had reduced hearing in L ear -denies: pain, drainage, nasal congestion, fever   ROS: See pertinent positives and negatives per HPI.  Past Medical History  Diagnosis Date  . Hyperlipidemia   . Hypertension     Past Surgical History  Procedure Laterality Date  . Cholecystectomy      Family History  Problem Relation Age of Onset  . Pneumonia Mother     History   Social History  . Marital Status: Single    Spouse Name: N/A    Number of Children: N/A  . Years of Education: N/A   Social History Main Topics  . Smoking status: Former Research scientist (life sciences)  . Smokeless tobacco: None  . Alcohol Use: None  . Drug Use: None  . Sexual Activity: None   Other Topics Concern  . None   Social History Narrative    Current outpatient prescriptions: aspirin 325 MG tablet, Take 325 mg by mouth daily.  , Disp: , Rfl: ;  lisinopril-hydrochlorothiazide (PRINZIDE,ZESTORETIC) 20-12.5 MG per tablet, Take 1 tablet by mouth daily., Disp: 100 tablet, Rfl: 3;  sildenafil (VIAGRA) 100 MG tablet, Take 1 tablet (100 mg total) by mouth as needed., Disp: 10 tablet, Rfl: 10 simvastatin (ZOCOR) 20 MG tablet, Take 1 tablet (20 mg total) by mouth at bedtime., Disp: 90 tablet, Rfl: 3  EXAM:  Filed Vitals:   12/28/14 0802  BP: 138/92  Pulse: 51  Temp: 98.2 F (36.8 C)    Body mass index is 26.14 kg/(m^2).  GENERAL: vitals reviewed and listed above, alert, oriented, appears well hydrated and in no acute distress  HEENT: atraumatic, conjunttiva clear, no obvious abnormalities on inspection of external nose and ears; cerumen impaction L - dull, scarred appearance of TM after ear lavage with possible effusion, no bulging  NECK: no obvious masses on inspection  MS: moves all extremities without noticeable abnormality  PSYCH: pleasant and cooperative, no obvious depression or  anxiety  ASSESSMENT AND PLAN:  Discussed the following assessment and plan:  Cerumen impaction, left  -he opted for ear lavage after discussion risks/benefits -hearing much improved after procedure, tolerated well, no pain, dull, scarred appearance of TM with ? Effusion after lavage - advised recheck with PCP at physical -Patient advised to return or notify a doctor immediately if symptoms worsen or persist or new concerns arise.  Patient Instructions  BEFORE YOU LEAVE: -flush ears  Have Dr. Sherren Mocha recheck your ear at your physical on Thursday     [place], [place]

## 2014-12-28 NOTE — Progress Notes (Signed)
Pre visit review using our clinic review tool, if applicable. No additional management support is needed unless otherwise documented below in the visit note. 

## 2014-12-30 ENCOUNTER — Encounter: Payer: Self-pay | Admitting: Family Medicine

## 2014-12-30 ENCOUNTER — Ambulatory Visit (INDEPENDENT_AMBULATORY_CARE_PROVIDER_SITE_OTHER): Payer: No Typology Code available for payment source | Admitting: Family Medicine

## 2014-12-30 VITALS — BP 110/80 | Temp 98.0°F | Ht 71.75 in | Wt 188.0 lb

## 2014-12-30 DIAGNOSIS — R972 Elevated prostate specific antigen [PSA]: Secondary | ICD-10-CM

## 2014-12-30 DIAGNOSIS — E785 Hyperlipidemia, unspecified: Secondary | ICD-10-CM

## 2014-12-30 DIAGNOSIS — I4510 Unspecified right bundle-branch block: Secondary | ICD-10-CM | POA: Insufficient documentation

## 2014-12-30 DIAGNOSIS — Z0000 Encounter for general adult medical examination without abnormal findings: Secondary | ICD-10-CM

## 2014-12-30 DIAGNOSIS — I10 Essential (primary) hypertension: Secondary | ICD-10-CM

## 2014-12-30 DIAGNOSIS — F528 Other sexual dysfunction not due to a substance or known physiological condition: Secondary | ICD-10-CM

## 2014-12-30 MED ORDER — SILDENAFIL CITRATE 100 MG PO TABS: 100.0000 mg | ORAL_TABLET | ORAL | Status: DC | PRN

## 2014-12-30 MED ORDER — SIMVASTATIN 20 MG PO TABS: 20.0000 mg | ORAL_TABLET | Freq: Every day | ORAL | Status: DC

## 2014-12-30 MED ORDER — LISINOPRIL-HYDROCHLOROTHIAZIDE 20-12.5 MG PO TABS: 1.0000 | ORAL_TABLET | Freq: Every day | ORAL | Status: DC

## 2014-12-30 NOTE — Progress Notes (Signed)
Pre visit review using our clinic review tool, if applicable. No additional management support is needed unless otherwise documented below in the visit note. 

## 2014-12-30 NOTE — Patient Instructions (Signed)
Continue current medications  Remember to walk daily  Get that colonoscopy  Follow-up in one year sooner if any problems

## 2014-12-30 NOTE — Progress Notes (Signed)
   Subjective:    Patient ID: Brett King, male    DOB: May 30, 1951, 63 y.o.   MRN: [id number]  HPI [name] is a 63 year old widowed male nonsmoker retired 2 years ago who comes in today for general physical examination for evaluation of hyperlipidemia, hypertension, and erectile dysfunction.  He says he feels well and has no major complaints  He retired year and half ago does not exercise on a daily basis  He gets routine eye care, dental care, colonoscopy 2000. He got a card to go back for follow-up however he says he can't go because he can't get a ride. I suggested a taxi or friend however he says he doesn't have any friends and   Review of Systems  Constitutional: Negative.   HENT: Negative.   Eyes: Negative.   Respiratory: Negative.   Cardiovascular: Negative.   Gastrointestinal: Negative.   Endocrine: Negative.   Genitourinary: Negative.   Musculoskeletal: Negative.   Skin: Negative.   Allergic/Immunologic: Negative.   Neurological: Negative.   Hematological: Negative.   Psychiatric/Behavioral: Negative.        Objective:   Physical Exam  Constitutional: He is oriented to person, place, and time. He appears well-developed and well-nourished.  HENT:  Head: Normocephalic and atraumatic.  Right Ear: External ear normal.  Left Ear: External ear normal.  Nose: Nose normal.  Mouth/Throat: Oropharynx is clear and moist.  Eyes: Conjunctivae and EOM are normal. Pupils are equal, round, and reactive to light.  Neck: Normal range of motion. Neck supple. No JVD present. No tracheal deviation present. No thyromegaly present.  Cardiovascular: Normal rate, regular rhythm, normal heart sounds and intact distal pulses.  Exam reveals no gallop and no friction rub.   No murmur heard. Pulmonary/Chest: Effort normal and breath sounds normal. No stridor. No respiratory distress. He has no wheezes. He has no rales. He exhibits no tenderness.  Abdominal: Soft. Bowel sounds are normal. He  exhibits no distension and no mass. There is no tenderness. There is no rebound and no guarding.  Genitourinary: Rectum normal, prostate normal and penis normal. Guaiac negative stool. No penile tenderness.  Musculoskeletal: Normal range of motion. He exhibits no edema or tenderness.  Lymphadenopathy:    He has no cervical adenopathy.  Neurological: He is alert and oriented to person, place, and time. He has normal reflexes. No cranial nerve deficit. He exhibits normal muscle tone.  Skin: Skin is warm and dry. No rash noted. No erythema. No pallor.  Psychiatric: He has a normal mood and affect. His behavior is normal. Judgment and thought content normal.  Nursing note and vitals reviewed.         Assessment & Plan:  Hypertension ago continue current therapy  Hyperlipidemia goal continue current therapy history of erectile dysfunction,,,,,,,,,,, refilled meds per patient request

## 2015-01-03 ENCOUNTER — Telehealth: Payer: Self-pay | Admitting: Family Medicine

## 2015-01-03 NOTE — Telephone Encounter (Signed)
emmi mailed  °

## 2015-01-26 ENCOUNTER — Encounter: Payer: Self-pay | Admitting: Interventional Cardiology

## 2015-01-26 ENCOUNTER — Ambulatory Visit (INDEPENDENT_AMBULATORY_CARE_PROVIDER_SITE_OTHER): Payer: 59 | Admitting: Interventional Cardiology

## 2015-01-26 VITALS — BP 134/92 | HR 105 | Ht 71.0 in | Wt 191.8 lb

## 2015-01-26 DIAGNOSIS — I10 Essential (primary) hypertension: Secondary | ICD-10-CM

## 2015-01-26 DIAGNOSIS — F528 Other sexual dysfunction not due to a substance or known physiological condition: Secondary | ICD-10-CM

## 2015-01-26 DIAGNOSIS — E785 Hyperlipidemia, unspecified: Secondary | ICD-10-CM

## 2015-01-26 DIAGNOSIS — R9431 Abnormal electrocardiogram [ECG] [EKG]: Secondary | ICD-10-CM

## 2015-01-26 DIAGNOSIS — I4510 Unspecified right bundle-branch block: Secondary | ICD-10-CM

## 2015-01-26 NOTE — Progress Notes (Signed)
Patient ID: Brett King, male   DOB: April 13, 1951, 64 y.o.   MRN: [id number]    Cardiology Office Note   Date:  01/26/2015   ID:  Brett King, DOB December 18, 1951, MRN [id number]  PCP:  Joycelyn Man, MD  Cardiologist:   Sinclair Grooms, MD   New inferior Q-wave abnormality    History of Present Illness: Brett King is a 64 y.o. male who presents for evaluation of EKG abnormalities identified by Dr. Stevie Kern. The patient has a long-standing history of right bundle branch block dating back to 2010. The most recent EKG in addition demonstrates inferior Q waves. The patient gives a history of heartburn for which she takes over-the-counter Pepcid. He has no exertional discomfort. As recently as November he has been walking up to 3 miles per day without chest discomfort or excessive dyspnea. He is a smoker but discontinue cigarette smoking 15 years ago. He denies claudication. Does no history of vascular disease. He denies orthopnea and PND.    Past Medical History  Diagnosis Date  . Hyperlipidemia   . Hypertension     Past Surgical History  Procedure Laterality Date  . Cholecystectomy       Current Outpatient Prescriptions  Medication Sig Dispense Refill  . aspirin 325 MG tablet Take 325 mg by mouth daily.      [name] lisinopril-hydrochlorothiazide (PRINZIDE,ZESTORETIC) 20-12.5 MG per tablet Take 1 tablet by mouth daily. 100 tablet 3  . sildenafil (VIAGRA) 100 MG tablet Take 1 tablet (100 mg total) by mouth as needed. 10 tablet 10  . simvastatin (ZOCOR) 20 MG tablet Take 1 tablet (20 mg total) by mouth at bedtime. 90 tablet 3   No current facility-administered medications for this visit.    Allergies:   Review of patient's allergies indicates no known allergies.    Social History:  The patient  reports that he has quit smoking. He does not have any smokeless tobacco history on file.   Family History:  The patient's family history includes Pneumonia in his mother.    ROS:   Please see the history of present illness.   Otherwise, review of systems are positive for suggestion of dysphagia, reflux with vomiting after eating greasy foods.   All other systems are reviewed and negative.    PHYSICAL EXAM: VS:  BP 134/92 mmHg  Pulse 105  Ht 5\' 11"  (1.803 m)  Wt 191 lb 12.8 oz (87 kg)  BMI 26.76 kg/m2  SpO2 96% , BMI Body mass index is 26.76 kg/(m^2). GEN: Well nourished, well developed, in no acute distress HEENT: normal Neck: no JVD, carotid bruits, or masses Cardiac: Slightly tachycardic but otherwise RRR; no murmurs, rubs, or gallops,no edema  Respiratory:  clear to auscultation bilaterally, normal work of breathing GI: soft, nontender, nondistended, + BS MS: no deformity or atrophy Skin: warm and dry, no rash Neuro:  Strength and sensation are intact Psych: euthymic mood, full affect   EKG:  EKG is ordered today. The ekg ordered today demonstrates NSR, right bundle branch block, and inferior Q waves. The Q waves in When compared to last year's tracing.   Recent Labs: 12/16/2014: ALT 33; BUN 13; Creatinine 0.9; Hemoglobin 15.7; Platelets 247.0; Potassium 4.7; Sodium 139; TSH 1.91    Lipid Panel    Component Value Date/Time   CHOL 225* 12/16/2014 0805   TRIG 75.0 12/16/2014 0805   TRIG 49 11/08/2006 0901   HDL 67.80 12/16/2014 0805   CHOLHDL 3 12/16/2014 0805  VLDL 15.0 12/16/2014 0805   LDLCALC 142* 12/16/2014 0805   LDLDIRECT 124.1 10/27/2013 0813   LDLDIRECT 119.8 11/08/2006 0901      Wt Readings from Last 3 Encounters:  01/26/15 191 lb 12.8 oz (87 kg)  12/30/14 188 lb (85.276 kg)  12/28/14 192 lb 12.8 oz (87.454 kg)      Other studies Reviewed: Additional studies/ records that were reviewed today include: Multiple prior EKGs. Review of the above records demonstrates:  Inferior Q waves   ASSESSMENT AND PLAN:  1.  Abnormal EKG showing right bundle branch block which is old and the appearance of new inferior Q waves compared to  one year ago. No symptoms to suggest angina or recent infarction although there is an overlying history of "indigestion" that is associated with recumbency after fatty meals that results in nausea and vomiting. The patient will need to have a stress Cardiolite performed to rule out coronary disease given his risk factors (ED, hyperlipidemia, hypertension, age, sex, etc.). We will also perform a 2-D Doppler echocardiogram to exclude LV wall motion abnormality and to assess overall LV systolic function 2. Essential hypertension, controlled 3. Hyperlipidemia, not well controlled with LDL of 120 4. Chronic right bundle branch block 5. Erectile dysfunction    Current medicines are reviewed at length with the patient today.  The patient does not have concerns regarding medicines.  The following changes have been made:  The plan is to perform an ischemic evaluation and to investigate the structural integrity of the heart as outlined above.  Labs/ tests ordered today include:   Orders Placed This Encounter  Procedures  . Myocardial Perfusion Imaging  . 2D Echocardiogram without contrast     Disposition:   FU with Linard Millers in as needed based upon laboratory data/cardiac eval   Signed, Sinclair Grooms, MD  01/26/2015 9:28 AM    [address], [place],   [address] Phone: [phone]; Fax: [phone]

## 2015-01-26 NOTE — Patient Instructions (Signed)
Your physician recommends that you continue on your current medications as directed. Please refer to the Current Medication list given to you today.  Your physician has requested that you have an echocardiogram. Echocardiography is a painless test that uses sound waves to create images of your heart. It provides your doctor with information about the size and shape of your heart and how well your heart's chambers and valves are working. This procedure takes approximately one hour. There are no restrictions for this procedure.   Your physician has requested that you have en exercise stress myoview. For further information please visit [email or web address]. Please follow instruction sheet, as given.  Your physician recommends that you schedule a follow-up appointment as needed

## 2015-01-27 ENCOUNTER — Ambulatory Visit: Payer: No Typology Code available for payment source | Admitting: Interventional Cardiology

## 2015-01-31 ENCOUNTER — Ambulatory Visit (HOSPITAL_COMMUNITY): Payer: 59 | Attending: Cardiovascular Disease | Admitting: Radiology

## 2015-01-31 DIAGNOSIS — R9431 Abnormal electrocardiogram [ECG] [EKG]: Secondary | ICD-10-CM | POA: Diagnosis not present

## 2015-01-31 DIAGNOSIS — E785 Hyperlipidemia, unspecified: Secondary | ICD-10-CM | POA: Diagnosis not present

## 2015-01-31 DIAGNOSIS — I10 Essential (primary) hypertension: Secondary | ICD-10-CM | POA: Insufficient documentation

## 2015-01-31 NOTE — Progress Notes (Signed)
Echocardiogram performed.  

## 2015-02-02 ENCOUNTER — Ambulatory Visit (HOSPITAL_COMMUNITY): Payer: 59 | Attending: Internal Medicine | Admitting: Radiology

## 2015-02-02 ENCOUNTER — Telehealth: Payer: Self-pay

## 2015-02-02 DIAGNOSIS — I4510 Unspecified right bundle-branch block: Secondary | ICD-10-CM

## 2015-02-02 DIAGNOSIS — R9431 Abnormal electrocardiogram [ECG] [EKG]: Secondary | ICD-10-CM | POA: Diagnosis not present

## 2015-02-02 MED ORDER — TECHNETIUM TC 99M SESTAMIBI GENERIC - CARDIOLITE
10.0000 | Freq: Once | INTRAVENOUS | Status: AC | PRN
  Administered 2015-02-02: 10 via INTRAVENOUS

## 2015-02-02 MED ORDER — TECHNETIUM TC 99M SESTAMIBI GENERIC - CARDIOLITE
30.0000 | Freq: Once | INTRAVENOUS | Status: AC | PRN
  Administered 2015-02-02: 30 via INTRAVENOUS

## 2015-02-02 NOTE — Telephone Encounter (Signed)
called to give pt echo results.lmtcb 

## 2015-02-02 NOTE — Progress Notes (Signed)
[facility] [address] [place],  [address] [phone]    Cardiology Nuclear Med Study  Brett King is a 64 y.o. male     MRN : [id number]     DOB: 1951/12/27  Procedure Date: 02/02/2015  Nuclear Med Background Indication for Stress Test:  Evaluation for Ischemia and Abnormal EKG History:  No known CAD Cardiac Risk Factors: Hypertension and RBBB  Symptoms:  Indigestion   Nuclear Pre-Procedure Caffeine/Decaff Intake:  None NPO After: 6 pm   Lungs:  clear O2 Sat: 97% on room air. IV 0.9% NS with Angio Cath:  22g  IV Site: R Hand  IV Started by:  Crissie Figures, RN  Chest Size (in):  44 Cup Size: n/a  Height: 5\' 11"  (1.803 m)  Weight:  183 lb (83.008 kg)  BMI:  Body mass index is 25.53 kg/(m^2). Tech Comments:  N/A    Nuclear Med Study 1 or 2 day study: 1 day  Stress Test Type:  Stress  Reading MD: N/A  Order Authorizing Provider:  Daneen Schick, MD  Resting Radionuclide: Technetium 82m Sestamibi  Resting Radionuclide Dose: 11.0 mCi   Stress Radionuclide:  Technetium 38m Sestamibi  Stress Radionuclide Dose: 33.0 mCi           Stress Protocol Rest HR: 91 Stress HR: 164  Rest BP: 147/86 Stress BP: 207/91  Exercise Time (min): 5:00 METS: 7.0   Predicted Max HR: 157 bpm % Max HR: 104.46 bpm Rate Pressure Product: 33948   Dose of Adenosine (mg):  n/a Dose of Lexiscan: n/a mg  Dose of Atropine (mg): n/a Dose of Dobutamine: n/a mcg/kg/min (at max HR)  Stress Test Technologist: Glade Lloyd, BS-ES  Nuclear Technologist:  Earl Many, CNMT     Rest Procedure:  Myocardial perfusion imaging was performed at rest 45 minutes following the intravenous administration of Technetium 24m Sestamibi. Rest ECG: NSR-RBBB  Stress Procedure:  The patient exercised on the treadmill utilizing the Bruce Protocol for 5:00 minutes. The patient stopped due to fatigue and denied any chest pain.  Technetium 50m Sestamibi was injected at peak exercise and  myocardial perfusion imaging was performed after a brief delay. Stress ECG: No significant change from baseline ECG  QPS Raw Data Images:  Normal; no motion artifact; normal heart/lung ratio. Stress Images:  Small, mild basal to mid inferior perfusion defect. Rest Images:  Small, mild basal to mid inferior perfusion defect. Subtraction (SDS):  Fixed small, mild basal to mid inferior perfusion defect. Transient Ischemic Dilatation (Normal <1.22):  0.74 Lung/Heart Ratio (Normal <0.45):  0.26  Quantitative Gated Spect Images QGS EDV:  54 ml QGS ESV:  13 ml  Impression Exercise Capacity:  Below average. BP Response:  Hypertensive blood pressure response. Clinical Symptoms:  Fatigue, no chest pain.  ECG Impression:  No significant ST segment change suggestive of ischemia. Comparison with Prior Nuclear Study: No images to compare  Overall Impression:  Low risk stress nuclear study with a fixed, small mild basal to mid inferior perfusion defect. Given normal wall motion inferiorly, this may be due to diaphragmatic attenuation.  No ischemia.                                                                                                                                                                                                                                    [name]  LV Ejection Fraction: 77%.  LV Wall Motion:  NL LV Function; NL Wall Motion   Loralie Champagne 02/02/2015

## 2015-02-02 NOTE — Telephone Encounter (Signed)
-----   Message from [place], MD sent at 01/31/2015  6:54 PM EST ----- LVH with diastolic dysfunction. No evidence of MI.

## 2015-02-07 NOTE — Telephone Encounter (Signed)
-----   Message from [place], MD sent at 01/31/2015  6:54 PM EST ----- LVH with diastolic dysfunction. No evidence of MI.

## 2015-02-07 NOTE — Telephone Encounter (Signed)
Pt aware of echo results.LVH with diastolic dysfunction. No evidence of MI.

## 2015-02-07 NOTE — Telephone Encounter (Signed)
-----   Message from Sinclair Grooms, MD sent at 02/05/2015  3:26 PM EST ----- Low risk nuclear study.Overall, workup is okay.

## 2015-02-07 NOTE — Telephone Encounter (Signed)
Pt aware of myoview results.Low risk nuclear study.Overall, workup is okay.pt verbalized understanding.

## 2015-04-06 ENCOUNTER — Other Ambulatory Visit: Payer: Self-pay | Admitting: Family Medicine

## 2015-04-06 DIAGNOSIS — E785 Hyperlipidemia, unspecified: Secondary | ICD-10-CM

## 2015-04-06 DIAGNOSIS — I10 Essential (primary) hypertension: Secondary | ICD-10-CM

## 2015-04-06 MED ORDER — LISINOPRIL-HYDROCHLOROTHIAZIDE 20-12.5 MG PO TABS: 1.0000 | ORAL_TABLET | Freq: Every day | ORAL | Status: DC

## 2015-04-06 MED ORDER — SIMVASTATIN 20 MG PO TABS: 20.0000 mg | ORAL_TABLET | Freq: Every day | ORAL | Status: DC

## 2015-04-06 NOTE — Telephone Encounter (Signed)
Pt needs new rxs lisinopril-hctz #90 and simvastatin 20 mg #90 w/refills sent to [facility] [address]

## 2015-04-06 NOTE — Telephone Encounter (Signed)
rx's sent in electronically 

## 2015-10-26 ENCOUNTER — Other Ambulatory Visit: Payer: Self-pay | Admitting: Family Medicine

## 2015-10-31 ENCOUNTER — Other Ambulatory Visit: Payer: Self-pay | Admitting: Family Medicine

## 2015-10-31 DIAGNOSIS — I10 Essential (primary) hypertension: Secondary | ICD-10-CM

## 2015-10-31 MED ORDER — LISINOPRIL-HYDROCHLOROTHIAZIDE 20-12.5 MG PO TABS: 1.0000 | ORAL_TABLET | Freq: Every day | ORAL | Status: DC

## 2016-02-14 ENCOUNTER — Ambulatory Visit (INDEPENDENT_AMBULATORY_CARE_PROVIDER_SITE_OTHER): Payer: Medicare Other | Admitting: Family Medicine

## 2016-02-14 ENCOUNTER — Encounter: Payer: Self-pay | Admitting: Family Medicine

## 2016-02-14 VITALS — BP 130/80 | Temp 98.2°F | Ht 71.0 in | Wt 192.0 lb

## 2016-02-14 DIAGNOSIS — Z23 Encounter for immunization: Secondary | ICD-10-CM | POA: Diagnosis not present

## 2016-02-14 DIAGNOSIS — Z0000 Encounter for general adult medical examination without abnormal findings: Secondary | ICD-10-CM

## 2016-02-14 DIAGNOSIS — E785 Hyperlipidemia, unspecified: Secondary | ICD-10-CM | POA: Diagnosis not present

## 2016-02-14 DIAGNOSIS — R351 Nocturia: Secondary | ICD-10-CM

## 2016-02-14 DIAGNOSIS — F528 Other sexual dysfunction not due to a substance or known physiological condition: Secondary | ICD-10-CM | POA: Diagnosis not present

## 2016-02-14 DIAGNOSIS — N401 Enlarged prostate with lower urinary tract symptoms: Secondary | ICD-10-CM | POA: Diagnosis not present

## 2016-02-14 DIAGNOSIS — I10 Essential (primary) hypertension: Secondary | ICD-10-CM

## 2016-02-14 LAB — HEPATIC FUNCTION PANEL
ALBUMIN: 4.5 g/dL (ref 3.5–5.2)
ALK PHOS: 59 U/L (ref 39–117)
ALT: 29 U/L (ref 0–53)
AST: 19 U/L (ref 0–37)
BILIRUBIN DIRECT: 0.1 mg/dL (ref 0.0–0.3)
Total Bilirubin: 0.5 mg/dL (ref 0.2–1.2)
Total Protein: 7.2 g/dL (ref 6.0–8.3)

## 2016-02-14 LAB — CBC WITH DIFFERENTIAL/PLATELET
Basophils Absolute: 0 10*3/uL (ref 0.0–0.1)
Basophils Relative: 0.7 % (ref 0.0–3.0)
EOS PCT: 2.4 % (ref 0.0–5.0)
Eosinophils Absolute: 0.2 10*3/uL (ref 0.0–0.7)
HCT: 44.5 % (ref 39.0–52.0)
Hemoglobin: 15.3 g/dL (ref 13.0–17.0)
LYMPHS ABS: 1 10*3/uL (ref 0.7–4.0)
Lymphocytes Relative: 14.4 % (ref 12.0–46.0)
MCHC: 34.5 g/dL (ref 30.0–36.0)
MCV: 90.5 fl (ref 78.0–100.0)
MONO ABS: 0.6 10*3/uL (ref 0.1–1.0)
MONOS PCT: 8.9 % (ref 3.0–12.0)
Neutro Abs: 4.9 10*3/uL (ref 1.4–7.7)
Neutrophils Relative %: 73.6 % (ref 43.0–77.0)
PLATELETS: 243 10*3/uL (ref 150.0–400.0)
RBC: 4.91 Mil/uL (ref 4.22–5.81)
RDW: 12.7 % (ref 11.5–15.5)
WBC: 6.7 10*3/uL (ref 4.0–10.5)

## 2016-02-14 LAB — POCT URINALYSIS DIPSTICK
Bilirubin, UA: NEGATIVE
Glucose, UA: NEGATIVE
KETONES UA: NEGATIVE
Leukocytes, UA: NEGATIVE
Nitrite, UA: NEGATIVE
PH UA: 6
PROTEIN UA: NEGATIVE
RBC UA: NEGATIVE
SPEC GRAV UA: 1.02
UROBILINOGEN UA: 0.2

## 2016-02-14 LAB — LIPID PANEL
Cholesterol: 198 mg/dL (ref 0–200)
HDL: 63.4 mg/dL (ref >39.00)
LDL Cholesterol: 122 mg/dL — ABNORMAL HIGH (ref 0–99)
NONHDL: 134.76
Total CHOL/HDL Ratio: 3
Triglycerides: 62 mg/dL (ref 0.0–149.0)
VLDL: 12.4 mg/dL (ref 0.0–40.0)

## 2016-02-14 LAB — BASIC METABOLIC PANEL
BUN: 17 mg/dL (ref 6–23)
CALCIUM: 10.4 mg/dL (ref 8.4–10.5)
CO2: 30 mEq/L (ref 19–32)
CREATININE: 1.09 mg/dL (ref 0.40–1.50)
Chloride: 105 mEq/L (ref 96–112)
GFR: 72.16 mL/min (ref >60.00)
Glucose, Bld: 112 mg/dL — ABNORMAL HIGH (ref 70–99)
Potassium: 5.3 mEq/L — ABNORMAL HIGH (ref 3.5–5.1)
Sodium: 142 mEq/L (ref 135–145)

## 2016-02-14 LAB — PSA: PSA: 0.78 ng/mL (ref 0.10–4.00)

## 2016-02-14 LAB — TSH: TSH: 1.29 u[IU]/mL (ref 0.35–4.50)

## 2016-02-14 MED ORDER — SIMVASTATIN 20 MG PO TABS: 20.0000 mg | ORAL_TABLET | Freq: Every day | ORAL | Status: DC

## 2016-02-14 MED ORDER — SILDENAFIL CITRATE 100 MG PO TABS: 100.0000 mg | ORAL_TABLET | ORAL | Status: DC | PRN

## 2016-02-14 MED ORDER — LISINOPRIL-HYDROCHLOROTHIAZIDE 20-12.5 MG PO TABS: 1.0000 | ORAL_TABLET | Freq: Every day | ORAL | Status: DC

## 2016-02-14 NOTE — Patient Instructions (Signed)
Continue current medications  Return in one year for general physical examination sooner if any problems  When you call in September for your checkup in January...........[name] pick one of the new 3 folks........... Tommi Rumps or Almyra Free are 2 new adult nurse practitioner's or Dr. Martin[place]

## 2016-02-14 NOTE — Progress Notes (Signed)
Pre visit review using our clinic review tool, if applicable. No additional management support is needed unless otherwise documented below in the visit note. 

## 2016-02-14 NOTE — Progress Notes (Signed)
   Subjective:    Patient ID: Brett King, male    DOB: [birth year], 65 y.o.   MRN: [id number]  HPI Brett King is a 65 year old married male nonsmoker who comes in today for general physical examination because of a history of hypertension and hyperlipidemia  He takes Zestoretic 20-12 0.5 daily BP at home 130/80  He uses Zocor 20 mg daily along with an aspirin tablet because of hyperlipidemia. Labs will be drawn today  He gets routine eye care, dental care, colonoscopy 5 years ago by Dr. Henrene Pastor. It doesn't show up in the computer's  Vaccinations he was given on Pneumovax. Flu shot was done in January 2017. Advised the future flu shot in the fall. Also advised to call his insurance company to find it were negative the shingles vaccine.  He uses Viagra 100 mg one half tab when necessary for mild ED he gives a mild headache   Review of Systems  Constitutional: Negative.   HENT: Negative.   Eyes: Negative.   Respiratory: Negative.   Cardiovascular: Negative.   Gastrointestinal: Negative.   Endocrine: Negative.   Genitourinary: Negative.   Musculoskeletal: Negative.   Skin: Negative.   Allergic/Immunologic: Negative.   Neurological: Negative.   Hematological: Negative.   Psychiatric/Behavioral: Negative.        Objective:   Physical Exam  Constitutional: He is oriented to person, place, and time. He appears well-developed and well-nourished.  HENT:  Head: Normocephalic and atraumatic.  Right Ear: External ear normal.  Left Ear: External ear normal.  Nose: Nose normal.  Mouth/Throat: Oropharynx is clear and moist.  Eyes: Conjunctivae and EOM are normal. Pupils are equal, round, and reactive to light.  Neck: Normal range of motion. Neck supple. No JVD present. No tracheal deviation present. No thyromegaly present.  Cardiovascular: Normal rate, regular rhythm, normal heart sounds and intact distal pulses.  Exam reveals no gallop and no friction rub.   No murmur heard. No carotid  neurologic bruits peripheral pulses 2+ and symmetrical  Pulmonary/Chest: Effort normal and breath sounds normal. No stridor. No respiratory distress. He has no wheezes. He has no rales. He exhibits no tenderness.  Abdominal: Soft. Bowel sounds are normal. He exhibits no distension and no mass. There is no tenderness. There is no rebound and no guarding.  Genitourinary: Rectum normal, prostate normal and penis normal. Guaiac negative stool. No penile tenderness.  Musculoskeletal: Normal range of motion. He exhibits no edema or tenderness.  Lymphadenopathy:    He has no cervical adenopathy.  Neurological: He is alert and oriented to person, place, and time. He has normal reflexes. No cranial nerve deficit. He exhibits normal muscle tone.  Skin: Skin is warm and dry. No rash noted. No erythema. No pallor.  Psychiatric: He has a normal mood and affect. His behavior is normal. Judgment and thought content normal.  Nursing note and vitals reviewed.         Assessment & Plan:  Healthy male  Hypertension at goal....... continue current therapy  Hyperlipidemia........... check labs  Mild ED.............. continue Viagra when necessary half tab.

## 2017-02-13 ENCOUNTER — Other Ambulatory Visit (INDEPENDENT_AMBULATORY_CARE_PROVIDER_SITE_OTHER): Payer: Medicare Other

## 2017-02-13 DIAGNOSIS — I10 Essential (primary) hypertension: Secondary | ICD-10-CM | POA: Diagnosis not present

## 2017-02-13 DIAGNOSIS — N401 Enlarged prostate with lower urinary tract symptoms: Secondary | ICD-10-CM | POA: Diagnosis not present

## 2017-02-13 DIAGNOSIS — Z0000 Encounter for general adult medical examination without abnormal findings: Secondary | ICD-10-CM

## 2017-02-13 DIAGNOSIS — R351 Nocturia: Secondary | ICD-10-CM | POA: Diagnosis not present

## 2017-02-13 DIAGNOSIS — E7800 Pure hypercholesterolemia, unspecified: Secondary | ICD-10-CM

## 2017-02-13 LAB — POC URINALSYSI DIPSTICK (AUTOMATED)
Bilirubin, UA: NEGATIVE
Blood, UA: NEGATIVE
GLUCOSE UA: NEGATIVE
Ketones, UA: NEGATIVE
Leukocytes, UA: NEGATIVE
NITRITE UA: NEGATIVE
PROTEIN UA: NEGATIVE
Spec Grav, UA: 1.015
UROBILINOGEN UA: 0.2
pH, UA: 7

## 2017-02-13 LAB — HEPATIC FUNCTION PANEL
ALK PHOS: 60 U/L (ref 39–117)
ALT: 37 U/L (ref 0–53)
AST: 31 U/L (ref 0–37)
Albumin: 4.7 g/dL (ref 3.5–5.2)
BILIRUBIN DIRECT: 0.1 mg/dL (ref 0.0–0.3)
Total Bilirubin: 0.6 mg/dL (ref 0.2–1.2)
Total Protein: 7.4 g/dL (ref 6.0–8.3)

## 2017-02-13 LAB — CBC WITH DIFFERENTIAL/PLATELET
Basophils Absolute: 0.1 10*3/uL (ref 0.0–0.1)
Basophils Relative: 0.8 % (ref 0.0–3.0)
EOS ABS: 0.2 10*3/uL (ref 0.0–0.7)
EOS PCT: 2.2 % (ref 0.0–5.0)
HCT: 46 % (ref 39.0–52.0)
Hemoglobin: 15.7 g/dL (ref 13.0–17.0)
LYMPHS ABS: 1 10*3/uL (ref 0.7–4.0)
Lymphocytes Relative: 13.4 % (ref 12.0–46.0)
MCHC: 34.2 g/dL (ref 30.0–36.0)
MCV: 93.3 fl (ref 78.0–100.0)
MONO ABS: 0.6 10*3/uL (ref 0.1–1.0)
Monocytes Relative: 8.8 % (ref 3.0–12.0)
Neutro Abs: 5.3 10*3/uL (ref 1.4–7.7)
Neutrophils Relative %: 74.8 % (ref 43.0–77.0)
Platelets: 250 10*3/uL (ref 150.0–400.0)
RBC: 4.93 Mil/uL (ref 4.22–5.81)
RDW: 12.9 % (ref 11.5–15.5)
WBC: 7.1 10*3/uL (ref 4.0–10.5)

## 2017-02-13 LAB — BASIC METABOLIC PANEL
BUN: 13 mg/dL (ref 6–23)
CALCIUM: 10.2 mg/dL (ref 8.4–10.5)
CO2: 26 meq/L (ref 19–32)
Chloride: 101 mEq/L (ref 96–112)
Creatinine, Ser: 0.94 mg/dL (ref 0.40–1.50)
GFR: 85.34 mL/min (ref >60.00)
Glucose, Bld: 107 mg/dL — ABNORMAL HIGH (ref 70–99)
POTASSIUM: 4.8 meq/L (ref 3.5–5.1)
SODIUM: 136 meq/L (ref 135–145)

## 2017-02-13 LAB — LIPID PANEL
CHOL/HDL RATIO: 3
Cholesterol: 223 mg/dL — ABNORMAL HIGH (ref 0–200)
HDL: 79.4 mg/dL (ref >39.00)
LDL Cholesterol: 130 mg/dL — ABNORMAL HIGH (ref 0–99)
NONHDL: 143.57
Triglycerides: 67 mg/dL (ref 0.0–149.0)
VLDL: 13.4 mg/dL (ref 0.0–40.0)

## 2017-02-13 LAB — TSH: TSH: 2.04 u[IU]/mL (ref 0.35–4.50)

## 2017-02-13 LAB — PSA: PSA: 0.97 ng/mL (ref 0.10–4.00)

## 2017-02-19 ENCOUNTER — Encounter: Payer: Self-pay | Admitting: Family Medicine

## 2017-02-19 ENCOUNTER — Ambulatory Visit (INDEPENDENT_AMBULATORY_CARE_PROVIDER_SITE_OTHER): Payer: Medicare Other | Admitting: Family Medicine

## 2017-02-19 VITALS — BP 124/90 | HR 115 | Temp 97.9°F | Ht 71.0 in | Wt 194.9 lb

## 2017-02-19 DIAGNOSIS — I10 Essential (primary) hypertension: Secondary | ICD-10-CM | POA: Diagnosis not present

## 2017-02-19 DIAGNOSIS — I4510 Unspecified right bundle-branch block: Secondary | ICD-10-CM | POA: Diagnosis not present

## 2017-02-19 DIAGNOSIS — F528 Other sexual dysfunction not due to a substance or known physiological condition: Secondary | ICD-10-CM | POA: Diagnosis not present

## 2017-02-19 DIAGNOSIS — E785 Hyperlipidemia, unspecified: Secondary | ICD-10-CM

## 2017-02-19 DIAGNOSIS — N401 Enlarged prostate with lower urinary tract symptoms: Secondary | ICD-10-CM | POA: Diagnosis not present

## 2017-02-19 DIAGNOSIS — R351 Nocturia: Secondary | ICD-10-CM | POA: Diagnosis not present

## 2017-02-19 DIAGNOSIS — Z23 Encounter for immunization: Secondary | ICD-10-CM

## 2017-02-19 MED ORDER — SILDENAFIL CITRATE 100 MG PO TABS: 100.0000 mg | ORAL_TABLET | ORAL | 10 refills | Status: DC | PRN

## 2017-02-19 MED ORDER — SILDENAFIL CITRATE 20 MG PO TABS: ORAL_TABLET | ORAL | 11 refills | Status: DC

## 2017-02-19 MED ORDER — LISINOPRIL-HYDROCHLOROTHIAZIDE 20-12.5 MG PO TABS: 1.0000 | ORAL_TABLET | Freq: Every day | ORAL | 4 refills | Status: DC

## 2017-02-19 MED ORDER — SIMVASTATIN 40 MG PO TABS: 40.0000 mg | ORAL_TABLET | Freq: Every day | ORAL | 4 refills | Status: DC

## 2017-02-19 NOTE — Progress Notes (Signed)
Pre visit review using our clinic review tool, if applicable. No additional management support is needed unless otherwise documented below in the visit note. 

## 2017-02-19 NOTE — Progress Notes (Signed)
Brett King is a 66 year old widowed male........ his wife died at 47 years ago for ruptured cerebral aneurysm...Brett KitchenMarland KitchenMarland King who comes in today for evaluation of hypertension hyperlipidemia  His blood pressure on Zestoretic 20-4 0.5 daily at home is 120/82 average.  He takes an aspirin and Zocor 20 mg daily. LDL is in the 1:30 range. We'll increase his Zocor to 40 mg daily  He gets routine eye care, dental care, colonoscopy last 2012 by Dr. Henrene Pastor. It's he's had a history of polyps.  Vaccinations tetanus booster will be due next fall in November. The be given a seasonal flu shot today.  Social history,,, widowed lives here in [place] retired from a Building surveyor. He walks 30 minutes daily.  He states he has a healthcare power of attorney and living will  Cognitive function normal he walks daily home health safety reviewed no issues identified, no guns in the house, he does have a healthcare power tablet in attorney living will as noted above. She  14 point review of systems reviewed and otherwise negative  EKG done because history of hypertension and hyperlipidemia. EKG normal and unchanged... Except for a right bundle branch block  BP 124/90 (BP Location: Right Arm, Patient Position: Sitting, Cuff Size: Normal)   Pulse (!) 115   Temp 97.9 F (36.6 C) (Oral)   Ht 5\' 11"  (1.803 m)   Wt 194 lb 14.4 oz (88.4 kg)   BMI 27.18 kg/m  HEENT were negative except he wears glasses neck was supple no adenopathy thyroid normal no carotid bruits cardiopulmonary exam normal Dahms exam normal genitalia normal circumcised male rectum normal stool guaiac-negative prostate .Brett King.. 1+ symmetrical nonnodular BPH ........ nocturia 1 ..l extremities normal skin normal peripheral pulses normal  #1 hypertension at goal........ continue current therapy  #2 hyperlipidemia........... increase Zocor to get LDL below 100 at  #3 BPH mild  #4 ED........Brett King refill Viagra

## 2017-02-19 NOTE — Patient Instructions (Signed)
Continue current medication  Continue your diet and exercise program  Check your blood pressure weekly to be sure it stays normal........[name] 135/85 or less  Viagra 100 mg...........[name] or Viagra 20 mg......... [email or web address] or [facility]  Return in one year sooner if any problems

## 2017-03-08 ENCOUNTER — Telehealth: Payer: Self-pay | Admitting: Family Medicine

## 2017-03-08 NOTE — Telephone Encounter (Signed)
Spoke to [employer] and informed her that the Zocor was increased to 40 mg on 02/19/17.  No further assistance needed.  Will close note.

## 2017-03-08 NOTE — Telephone Encounter (Signed)
Pharm needs clarification on simvastatin 40 mg. Pt was on previously on 20 mg

## 2017-10-16 ENCOUNTER — Telehealth: Payer: Self-pay | Admitting: Family Medicine

## 2017-10-16 NOTE — Telephone Encounter (Addendum)
Pt last cpx was in feb 2018. Pt stated dr todd told him to have his labs in advance. Please put order in system. Pt needs labs for 2019

## 2017-10-25 ENCOUNTER — Other Ambulatory Visit: Payer: Self-pay

## 2017-10-25 DIAGNOSIS — N401 Enlarged prostate with lower urinary tract symptoms: Secondary | ICD-10-CM

## 2017-10-25 DIAGNOSIS — E785 Hyperlipidemia, unspecified: Secondary | ICD-10-CM

## 2017-10-25 DIAGNOSIS — I10 Essential (primary) hypertension: Secondary | ICD-10-CM

## 2017-10-25 DIAGNOSIS — R351 Nocturia: Secondary | ICD-10-CM

## 2017-10-28 ENCOUNTER — Other Ambulatory Visit: Payer: Self-pay

## 2017-10-29 NOTE — Telephone Encounter (Signed)
Please advise 

## 2017-11-04 NOTE — Telephone Encounter (Signed)
Brett King please order his labs diagnosis hypertension hyperlipidemia and BPH

## 2017-11-05 ENCOUNTER — Other Ambulatory Visit: Payer: Self-pay

## 2017-11-05 DIAGNOSIS — N401 Enlarged prostate with lower urinary tract symptoms: Secondary | ICD-10-CM

## 2017-11-05 DIAGNOSIS — E785 Hyperlipidemia, unspecified: Secondary | ICD-10-CM

## 2017-11-05 DIAGNOSIS — I10 Essential (primary) hypertension: Secondary | ICD-10-CM

## 2017-11-05 DIAGNOSIS — R351 Nocturia: Secondary | ICD-10-CM

## 2017-11-05 NOTE — Telephone Encounter (Signed)
CPE labs for 2019 physical  have been ordered per Dr Sherren Mocha.

## 2018-02-24 ENCOUNTER — Encounter: Payer: Self-pay | Admitting: Family Medicine

## 2018-02-24 ENCOUNTER — Ambulatory Visit (INDEPENDENT_AMBULATORY_CARE_PROVIDER_SITE_OTHER): Payer: Medicare Other | Admitting: Family Medicine

## 2018-02-24 VITALS — BP 124/90 | HR 106 | Temp 98.3°F | Ht 71.0 in | Wt 190.0 lb

## 2018-02-24 DIAGNOSIS — I10 Essential (primary) hypertension: Secondary | ICD-10-CM

## 2018-02-24 DIAGNOSIS — N401 Enlarged prostate with lower urinary tract symptoms: Secondary | ICD-10-CM

## 2018-02-24 DIAGNOSIS — E785 Hyperlipidemia, unspecified: Secondary | ICD-10-CM | POA: Diagnosis not present

## 2018-02-24 DIAGNOSIS — I4510 Unspecified right bundle-branch block: Secondary | ICD-10-CM

## 2018-02-24 DIAGNOSIS — Z23 Encounter for immunization: Secondary | ICD-10-CM | POA: Diagnosis not present

## 2018-02-24 DIAGNOSIS — R351 Nocturia: Secondary | ICD-10-CM

## 2018-02-24 LAB — CBC WITH DIFFERENTIAL/PLATELET
Basophils Absolute: 0.1 10*3/uL (ref 0.0–0.1)
Basophils Relative: 0.9 % (ref 0.0–3.0)
EOS PCT: 1.1 % (ref 0.0–5.0)
Eosinophils Absolute: 0.1 10*3/uL (ref 0.0–0.7)
HEMATOCRIT: 45.3 % (ref 39.0–52.0)
HEMOGLOBIN: 15.7 g/dL (ref 13.0–17.0)
Lymphocytes Relative: 9.2 % — ABNORMAL LOW (ref 12.0–46.0)
Lymphs Abs: 0.8 10*3/uL (ref 0.7–4.0)
MCHC: 34.7 g/dL (ref 30.0–36.0)
MCV: 94.9 fl (ref 78.0–100.0)
MONOS PCT: 7.7 % (ref 3.0–12.0)
Monocytes Absolute: 0.7 10*3/uL (ref 0.1–1.0)
Neutro Abs: 7 10*3/uL (ref 1.4–7.7)
Neutrophils Relative %: 81.1 % — ABNORMAL HIGH (ref 43.0–77.0)
Platelets: 233 10*3/uL (ref 150.0–400.0)
RBC: 4.78 Mil/uL (ref 4.22–5.81)
RDW: 12.8 % (ref 11.5–15.5)
WBC: 8.6 10*3/uL (ref 4.0–10.5)

## 2018-02-24 LAB — HEPATIC FUNCTION PANEL
ALK PHOS: 60 U/L (ref 39–117)
ALT: 42 U/L (ref 0–53)
AST: 32 U/L (ref 0–37)
Albumin: 4.5 g/dL (ref 3.5–5.2)
BILIRUBIN DIRECT: 0.1 mg/dL (ref 0.0–0.3)
TOTAL PROTEIN: 7.4 g/dL (ref 6.0–8.3)
Total Bilirubin: 0.5 mg/dL (ref 0.2–1.2)

## 2018-02-24 LAB — BASIC METABOLIC PANEL
BUN: 13 mg/dL (ref 6–23)
CO2: 25 mEq/L (ref 19–32)
Calcium: 10.6 mg/dL — ABNORMAL HIGH (ref 8.4–10.5)
Chloride: 101 mEq/L (ref 96–112)
Creatinine, Ser: 1.03 mg/dL (ref 0.40–1.50)
GFR: 76.55 mL/min (ref >60.00)
GLUCOSE: 104 mg/dL — AB (ref 70–99)
POTASSIUM: 5.1 meq/L (ref 3.5–5.1)
SODIUM: 140 meq/L (ref 135–145)

## 2018-02-24 LAB — POCT URINALYSIS DIPSTICK
Bilirubin, UA: NEGATIVE
GLUCOSE UA: NEGATIVE
Ketones, UA: NEGATIVE
LEUKOCYTES UA: NEGATIVE
Nitrite, UA: NEGATIVE
ODOR: NEGATIVE
Protein, UA: NEGATIVE
RBC UA: NEGATIVE
Spec Grav, UA: 1.025 (ref 1.010–1.025)
Urobilinogen, UA: 0.2 E.U./dL
pH, UA: 6 (ref 5.0–8.0)

## 2018-02-24 LAB — LIPID PANEL
CHOL/HDL RATIO: 3
CHOLESTEROL: 233 mg/dL — AB (ref 0–200)
HDL: 77.7 mg/dL (ref >39.00)
LDL Cholesterol: 141 mg/dL — ABNORMAL HIGH (ref 0–99)
NonHDL: 155.58
TRIGLYCERIDES: 71 mg/dL (ref 0.0–149.0)
VLDL: 14.2 mg/dL (ref 0.0–40.0)

## 2018-02-24 LAB — TSH: TSH: 1.83 u[IU]/mL (ref 0.35–4.50)

## 2018-02-24 LAB — PSA: PSA: 0.76 ng/mL (ref 0.10–4.00)

## 2018-02-24 MED ORDER — LISINOPRIL-HYDROCHLOROTHIAZIDE 20-12.5 MG PO TABS: 1.0000 | ORAL_TABLET | Freq: Every day | ORAL | 4 refills | Status: DC

## 2018-02-24 MED ORDER — SILDENAFIL CITRATE 20 MG PO TABS: ORAL_TABLET | ORAL | 11 refills | Status: DC

## 2018-02-24 MED ORDER — SIMVASTATIN 40 MG PO TABS: 40.0000 mg | ORAL_TABLET | Freq: Every day | ORAL | 4 refills | Status: DC

## 2018-02-24 NOTE — Patient Instructions (Signed)
Continue current medications  Continue exercise program  Labs today..............[name] we will call you if is anything abnormal.  Return in one year for general physical exam sooner if any problem  Call your insurance company to find out where he get the shingles vaccine  We gave you a tetanus booster today.......... it's good for 10 years

## 2018-02-24 NOTE — Progress Notes (Signed)
Alando is a 67 year old married male nonsmoker who comes in today for evaluation of hypertension hyperlipidemia  For hypertension takes Zestoretic 20-12 0.5. BP at home is 130/80  For hyperlipidemia takes Zocor 40 mg daily along with an adult aspirin. We'll check labs today  He has mild ED for which he uses generic Viagra  He gets routine eye care, dental care, colonoscopy 2012.  Vaccinations tetanus booster given a day. Too late for seasonal flu shot. Information given on shingles  Social history........[name] retired walks 30 minutes daily with his new puppy.  14 point review of systems reviewed and otherwise negative  EKG was done because of a history of above. EKG shows some abnormalities that of her previously noted. No new changes on his cardiogram. Cardiac-wise he's asymptomatic.  BP 124/90 (BP Location: Left Arm, Patient Position: Sitting, Cuff Size: Normal)   Pulse (!) 106   Temp 98.3 F (36.8 C) (Oral)   Ht 5\' 11"  (1.803 m)   Wt 190 lb (86.2 kg)   BMI 26.50 kg/m  Well-developed well-nourished male no acute distress vital signs stable he is afebrile HEENT were negative neck was supple no adenopathy. Thyroid normal no carotid bruits cardiopulmonary exam normal Dahms abnormal gym to normal circumcised male with normal stool guaiac-negative prostate normal extremities normal skin normal peripheral pulses normal  #1 hypertension at goal........... continue current therapy  #2 hyperlipidemia.......... continue current therapy check labs  #3 mild ED......... continue current medications.

## 2018-09-16 ENCOUNTER — Telehealth: Payer: Self-pay | Admitting: *Deleted

## 2018-09-16 NOTE — Telephone Encounter (Signed)
Copied from [place] [phone]. Topic: Quick Communication - See Telephone Encounter >> Sep 16, 2018  3:43 PM Antonieta Iba C wrote: CRM for notification. See Telephone encounter for: 09/16/18.  Pt scheduled his CPE for next year (01/2019) pt would like to have labs before his apt. Please assist pt further for this request.   CB: [phone]   Patient has been contacted to reschedule his physical.

## 2018-10-23 ENCOUNTER — Other Ambulatory Visit: Payer: Self-pay | Admitting: Family Medicine

## 2018-11-05 DIAGNOSIS — Z23 Encounter for immunization: Secondary | ICD-10-CM | POA: Diagnosis not present

## 2018-11-19 ENCOUNTER — Other Ambulatory Visit: Payer: Self-pay

## 2019-01-13 ENCOUNTER — Ambulatory Visit (INDEPENDENT_AMBULATORY_CARE_PROVIDER_SITE_OTHER): Payer: Medicare Other | Admitting: Adult Health

## 2019-01-13 ENCOUNTER — Encounter: Payer: Self-pay | Admitting: Adult Health

## 2019-01-13 VITALS — BP 136/82 | Temp 97.8°F | Wt 180.0 lb

## 2019-01-13 DIAGNOSIS — Z7689 Persons encountering health services in other specified circumstances: Secondary | ICD-10-CM

## 2019-01-13 DIAGNOSIS — I10 Essential (primary) hypertension: Secondary | ICD-10-CM

## 2019-01-13 DIAGNOSIS — F528 Other sexual dysfunction not due to a substance or known physiological condition: Secondary | ICD-10-CM

## 2019-01-13 DIAGNOSIS — E7800 Pure hypercholesterolemia, unspecified: Secondary | ICD-10-CM

## 2019-01-13 NOTE — Progress Notes (Signed)
Patient presents to clinic today to establish care. He is a pleasant 68 year old male who  has a past medical history of Hyperlipidemia and Hypertension.  He is a former patient of Dr. Sherren Mocha.   He last had a CPE done in February 2019   Acute Concerns: Establish Care  No acute compalints   Chronic Issues: Essential Hypertension - Takes lisinopril/hydrochlorothiazide 20-12.5 mg BP Readings from Last 3 Encounters:  01/13/19 136/82  02/24/18 124/90  02/19/17 124/90    Hyperlipidemia - Takes Zocor 40 mg and daily ASA Lab Results  Component Value Date   CHOL 233 (H) 02/24/2018   HDL 77.70 02/24/2018   LDLCALC 141 (H) 02/24/2018   LDLDIRECT 124.1 10/27/2013   TRIG 71.0 02/24/2018   CHOLHDL 3 02/24/2018   ED - takes Viagra as needed  Health Maintenance: Dental -- Routine  Vision -- Routine  Immunizations -- Utd  Colonoscopy -- Due in 2022 Diet: He eats healthy  Exercise: Walks and is getting a gym membership with his new insurance.   Past Medical History:  Diagnosis Date  . Hyperlipidemia   . Hypertension     Past Surgical History:  Procedure Laterality Date  . CHOLECYSTECTOMY      Current Outpatient Medications on File Prior to Visit  Medication Sig Dispense Refill  . aspirin 325 MG tablet Take 325 mg by mouth daily.      [name] lisinopril-hydrochlorothiazide (PRINZIDE,ZESTORETIC) 20-12.5 MG tablet Take 1 tablet by mouth daily. 100 tablet 4  . sildenafil (REVATIO) 20 MG tablet TAKE 2-5 TABLETS BY MOUTH 30 MINUTES PRIOR TO INTERCOURSE 25 tablet 11  . simvastatin (ZOCOR) 40 MG tablet Take 1 tablet (40 mg total) by mouth at bedtime. 90 tablet 4   No current facility-administered medications on file prior to visit.     No Known Allergies  Family History  Problem Relation Age of Onset  . Pneumonia Mother     Social History   Socioeconomic History  . Marital status: Single    Spouse name: Not on file  . Number of children: Not on file  . Years of education:  Not on file  . Highest education level: Not on file  Occupational History  . Not on file  Social Needs  . Financial resource strain: Not on file  . Food insecurity:    Worry: Not on file    Inability: Not on file  . Transportation needs:    Medical: Not on file    Non-medical: Not on file  Tobacco Use  . Smoking status: Former Research scientist (life sciences)  . Smokeless tobacco: Never Used  Substance and Sexual Activity  . Alcohol use: Yes    Comment: ocassionally  . Drug use: No  . Sexual activity: Not on file  Lifestyle  . Physical activity:    Days per week: Not on file    Minutes per session: Not on file  . Stress: Not on file  Relationships  . Social connections:    Talks on phone: Not on file    Gets together: Not on file    Attends religious service: Not on file    Active member of club or organization: Not on file    Attends meetings of clubs or organizations: Not on file    Relationship status: Not on file  . Intimate partner violence:    Fear of current or ex partner: Not on file    Emotionally abused: Not on file    Physically abused: Not  on file    Forced sexual activity: Not on file  Other Topics Concern  . Not on file  Social History Narrative  . Not on file    Review of Systems  Constitutional: Negative.   Eyes: Negative.   Respiratory: Negative.   Cardiovascular: Negative.   Gastrointestinal: Negative.   Genitourinary: Negative.   Musculoskeletal: Negative.   Neurological: Negative.   Psychiatric/Behavioral: Negative.     BP 136/82   Temp 97.8 F (36.6 C)   Wt 180 lb (81.6 kg)   BMI 25.10 kg/m   Physical Exam Vitals signs and nursing note reviewed.  Constitutional:      General: He is not in acute distress.    Appearance: Normal appearance. He is well-developed and normal weight. He is not diaphoretic.  HENT:     Nose: Nose normal. No congestion or rhinorrhea.     Mouth/Throat:     Mouth: Mucous membranes are moist.     Pharynx: Oropharynx is clear. No  oropharyngeal exudate.  Eyes:     General:        Right eye: No discharge.        Left eye: No discharge.     Conjunctiva/sclera: Conjunctivae normal.     Pupils: Pupils are equal, round, and reactive to light.  Neck:     Thyroid: No thyromegaly.     Trachea: No tracheal deviation.  Cardiovascular:     Rate and Rhythm: Normal rate and regular rhythm.     Pulses: Normal pulses.     Heart sounds: Normal heart sounds. No murmur. No friction rub. No gallop.   Pulmonary:     Effort: Pulmonary effort is normal. No respiratory distress.     Breath sounds: Normal breath sounds. No stridor. No wheezing, rhonchi or rales.  Chest:     Chest wall: No tenderness.  Lymphadenopathy:     Cervical: No cervical adenopathy.  Skin:    General: Skin is warm and dry.     Coloration: Skin is not jaundiced or pale.     Findings: No bruising, erythema, lesion or rash.  Neurological:     General: No focal deficit present.     Mental Status: He is alert and oriented to person, place, and time.     Cranial Nerves: No cranial nerve deficit.     Coordination: Coordination normal.  Psychiatric:        Mood and Affect: Mood normal.        Behavior: Behavior normal.        Thought Content: Thought content normal.        Judgment: Judgment normal.    Assessment/Plan: 1. Encounter to establish care - CPE on Feb 26th  - Follow up sooner if needed  2. Essential hypertension - No change in medications   3. Pure hypercholesterolemia - Will recheck lipids next month  - Consider dose change  - Continue to eat healthy and exercise   4. ERECTILE DYSFUNCTION, MILD - Viagra as needed  [name], NP

## 2019-02-25 ENCOUNTER — Encounter: Payer: Self-pay | Admitting: Adult Health

## 2019-02-25 ENCOUNTER — Ambulatory Visit (INDEPENDENT_AMBULATORY_CARE_PROVIDER_SITE_OTHER): Payer: Medicare Other | Admitting: Adult Health

## 2019-02-25 ENCOUNTER — Encounter: Payer: Medicare Other | Admitting: Family Medicine

## 2019-02-25 VITALS — BP 138/80 | Temp 97.7°F | Ht 71.0 in | Wt 182.0 lb

## 2019-02-25 DIAGNOSIS — Z0000 Encounter for general adult medical examination without abnormal findings: Secondary | ICD-10-CM

## 2019-02-25 DIAGNOSIS — R351 Nocturia: Secondary | ICD-10-CM | POA: Diagnosis not present

## 2019-02-25 DIAGNOSIS — N401 Enlarged prostate with lower urinary tract symptoms: Secondary | ICD-10-CM | POA: Diagnosis not present

## 2019-02-25 DIAGNOSIS — I10 Essential (primary) hypertension: Secondary | ICD-10-CM | POA: Diagnosis not present

## 2019-02-25 DIAGNOSIS — F528 Other sexual dysfunction not due to a substance or known physiological condition: Secondary | ICD-10-CM | POA: Diagnosis not present

## 2019-02-25 DIAGNOSIS — E7800 Pure hypercholesterolemia, unspecified: Secondary | ICD-10-CM

## 2019-02-25 DIAGNOSIS — Z1159 Encounter for screening for other viral diseases: Secondary | ICD-10-CM

## 2019-02-25 LAB — LIPID PANEL
CHOLESTEROL: 232 mg/dL — AB (ref 0–200)
HDL: 108.9 mg/dL (ref >39.00)
LDL Cholesterol: 112 mg/dL — ABNORMAL HIGH (ref 0–99)
NonHDL: 123.34
Total CHOL/HDL Ratio: 2
Triglycerides: 58 mg/dL (ref 0.0–149.0)
VLDL: 11.6 mg/dL (ref 0.0–40.0)

## 2019-02-25 LAB — CBC WITH DIFFERENTIAL/PLATELET
BASOS ABS: 0 10*3/uL (ref 0.0–0.1)
Basophils Relative: 0.6 % (ref 0.0–3.0)
EOS ABS: 0.1 10*3/uL (ref 0.0–0.7)
EOS PCT: 1.9 % (ref 0.0–5.0)
HCT: 46.4 % (ref 39.0–52.0)
Hemoglobin: 16 g/dL (ref 13.0–17.0)
LYMPHS ABS: 0.9 10*3/uL (ref 0.7–4.0)
Lymphocytes Relative: 12.1 % (ref 12.0–46.0)
MCHC: 34.4 g/dL (ref 30.0–36.0)
MCV: 94.2 fl (ref 78.0–100.0)
MONOS PCT: 7.3 % (ref 3.0–12.0)
Monocytes Absolute: 0.5 10*3/uL (ref 0.1–1.0)
NEUTROS PCT: 78.1 % — AB (ref 43.0–77.0)
Neutro Abs: 5.7 10*3/uL (ref 1.4–7.7)
PLATELETS: 230 10*3/uL (ref 150.0–400.0)
RBC: 4.93 Mil/uL (ref 4.22–5.81)
RDW: 12.9 % (ref 11.5–15.5)
WBC: 7.4 10*3/uL (ref 4.0–10.5)

## 2019-02-25 LAB — COMPREHENSIVE METABOLIC PANEL
ALT: 54 U/L — ABNORMAL HIGH (ref 0–53)
AST: 46 U/L — ABNORMAL HIGH (ref 0–37)
Albumin: 4.7 g/dL (ref 3.5–5.2)
Alkaline Phosphatase: 62 U/L (ref 39–117)
BUN: 13 mg/dL (ref 6–23)
CALCIUM: 10.4 mg/dL (ref 8.4–10.5)
CO2: 27 mEq/L (ref 19–32)
Chloride: 102 mEq/L (ref 96–112)
Creatinine, Ser: 0.96 mg/dL (ref 0.40–1.50)
GFR: 77.88 mL/min (ref >60.00)
Glucose, Bld: 96 mg/dL (ref 70–99)
Potassium: 4.2 mEq/L (ref 3.5–5.1)
Sodium: 142 mEq/L (ref 135–145)
Total Bilirubin: 0.7 mg/dL (ref 0.2–1.2)
Total Protein: 7.6 g/dL (ref 6.0–8.3)

## 2019-02-25 NOTE — Progress Notes (Signed)
Subjective:    Patient ID: Brett King, male    DOB: [birth year], 68 y.o.   MRN: [id number]  HPI  Patient presents for yearly preventative medicine examination. Pleasant 68 year old male who  has a past medical history of Hyperlipidemia and Hypertension.   Essential Hypertension -controlled with lisinopril/hydrochlorothiazide 20-12.5 mg.  Well controlled on this medication, denies dizziness, lightheadedness, headaches, blurred vision, chest pain, or shortness of breath  BP Readings from Last 3 Encounters:  02/25/19 138/80  01/13/19 136/82  02/24/18 124/90    Hyperlipidemia -takes Zocor 40 mg in a daily 81 mg aspirin.  He denies myalgias Lab Results  Component Value Date   CHOL 233 (H) 02/24/2018   HDL 77.70 02/24/2018   LDLCALC 141 (H) 02/24/2018   LDLDIRECT 124.1 10/27/2013   TRIG 71.0 02/24/2018   CHOLHDL 3 02/24/2018    ED- takes Viagra as needed  All immunizations and health maintenance protocols were reviewed with the patient and needed orders were placed.  He is up-to-date on vaccinations  Appropriate screening laboratory values were ordered for the patient including screening of hyperlipidemia, renal function and hepatic function. If indicated by BPH, a PSA was ordered.  Medication reconciliation,  past medical history, social history, problem list and allergies were reviewed in detail with the patient  Goals were established with regard to weight loss, exercise, and  diet in compliance with medications.  He eats healthy but has not started exercising yet.  Wt Readings from Last 3 Encounters:  02/25/19 182 lb (82.6 kg)  01/13/19 180 lb (81.6 kg)  02/24/18 190 lb (86.2 kg)   He is up-to-date on routine screening such as colonoscopy, dental and vision screen.  He has no acute complaints   Review of Systems  Constitutional: Negative.   HENT: Negative.   Eyes: Negative.   Respiratory: Negative.   Cardiovascular: Negative.   Gastrointestinal: Negative.     Endocrine: Negative.   Genitourinary: Negative.   Musculoskeletal: Negative.   Skin: Negative.   Allergic/Immunologic: Negative.   Neurological: Negative.   Hematological: Negative.   Psychiatric/Behavioral: Negative.   All other systems reviewed and are negative.  Past Medical History:  Diagnosis Date  . Hyperlipidemia   . Hypertension     Social History   Socioeconomic History  . Marital status: Single    Spouse name: Not on file  . Number of children: Not on file  . Years of education: Not on file  . Highest education level: Not on file  Occupational History  . Not on file  Social Needs  . Financial resource strain: Not on file  . Food insecurity:    Worry: Not on file    Inability: Not on file  . Transportation needs:    Medical: Not on file    Non-medical: Not on file  Tobacco Use  . Smoking status: Former Research scientist (life sciences)  . Smokeless tobacco: Never Used  Substance and Sexual Activity  . Alcohol use: Yes    Comment: ocassionally  . Drug use: No  . Sexual activity: Not on file  Lifestyle  . Physical activity:    Days per week: Not on file    Minutes per session: Not on file  . Stress: Not on file  Relationships  . Social connections:    Talks on phone: Not on file    Gets together: Not on file    Attends religious service: Not on file    Active member of club or organization: Not on  file    Attends meetings of clubs or organizations: Not on file    Relationship status: Not on file  . Intimate partner violence:    Fear of current or ex partner: Not on file    Emotionally abused: Not on file    Physically abused: Not on file    Forced sexual activity: Not on file  Other Topics Concern  . 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 CHOLECYSTECTOMY      Family History  Problem Relation Age of Onset  .  Pneumonia Mother   . Liver cancer Mother   . Pancreatic cancer Mother     No Known Allergies  Current Outpatient Medications on File Prior to Visit  Medication Sig Dispense Refill  . aspirin 325 MG tablet Take 325 mg by mouth daily.      [name] lisinopril-hydrochlorothiazide (PRINZIDE,ZESTORETIC) 20-12.5 MG tablet Take 1 tablet by mouth daily. 100 tablet 4  . sildenafil (REVATIO) 20 MG tablet TAKE 2-5 TABLETS BY MOUTH 30 MINUTES PRIOR TO INTERCOURSE 25 tablet 11  . simvastatin (ZOCOR) 40 MG tablet Take 1 tablet (40 mg total) by mouth at bedtime. 90 tablet 4   No current facility-administered medications on file prior to visit.     BP 138/80   Temp 97.7 F (36.5 C)   Ht 5\' 11"  (1.803 m)   Wt 182 lb (82.6 kg)   BMI 25.38 kg/m       Objective:   Physical Exam Vitals signs and nursing note reviewed.  Constitutional:      General: He is not in acute distress.    Appearance: Normal appearance. He is well-developed and normal weight. He is not diaphoretic.  HENT:     Head: Normocephalic and atraumatic.     Right Ear: Tympanic membrane, ear canal and external ear normal. There is no impacted cerumen.     Left Ear: Tympanic membrane, ear canal and external ear normal. There is no impacted cerumen.     Nose: Nose normal. No congestion or rhinorrhea.     Mouth/Throat:     Mouth: Mucous membranes are moist.     Pharynx: Oropharynx is clear. No oropharyngeal exudate.  Eyes:     General: No scleral icterus.       Right eye: No discharge.        Left eye: No discharge.     Extraocular Movements: Extraocular movements intact.     Conjunctiva/sclera: Conjunctivae normal.     Pupils: Pupils are equal, round, and reactive to light.  Neck:     Musculoskeletal: Normal range of motion and neck supple.     Thyroid: No thyromegaly.     Trachea: No tracheal deviation.  Cardiovascular:     Rate and Rhythm: Normal rate and regular rhythm.     Pulses: Normal pulses.     Heart sounds: Normal  heart sounds. No murmur. No friction rub. No gallop.   Pulmonary:     Effort: Pulmonary effort is normal. No respiratory distress.     Breath sounds: Normal breath sounds. No stridor. No wheezing, rhonchi or rales.  Chest:     Chest wall: No tenderness.  Abdominal:     General: Bowel sounds are normal. There is no distension.     Palpations: Abdomen is soft. There  is no mass.     Tenderness: There is no abdominal tenderness. There is no right CVA tenderness, left CVA tenderness, guarding or rebound.     Hernia: No hernia is present.  Musculoskeletal: Normal range of motion.        General: No swelling, tenderness, deformity or signs of injury.     Right lower leg: No edema.     Left lower leg: No edema.  Lymphadenopathy:     Cervical: No cervical adenopathy.  Skin:    General: Skin is warm and dry.     Capillary Refill: Capillary refill takes less than 2 seconds.     Coloration: Skin is not jaundiced or pale.     Findings: No bruising, erythema, lesion or rash.  Neurological:     General: No focal deficit present.     Mental Status: He is alert and oriented to person, place, and time.     Cranial Nerves: No cranial nerve deficit.     Coordination: Coordination normal.  Psychiatric:        Behavior: Behavior normal.        Thought Content: Thought content normal.        Judgment: Judgment normal.       Assessment & Plan:  1. Routine general medical examination at a health care facility - Follow up in one year  - Needs to start exercising  - CBC with Differential/Platelet - Comprehensive metabolic panel - Lipid panel - TSH  2. Pure hypercholesterolemia - Consider increase in statin  - CBC with Differential/Platelet - Comprehensive metabolic panel - Lipid panel - TSH  3. Essential hypertension - No change in medication  - Encouraged aerobic exercise  - CBC with Differential/Platelet - Comprehensive metabolic panel - Lipid panel - TSH  4. ERECTILE DYSFUNCTION,  MILD  - CBC with Differential/Platelet - Comprehensive metabolic panel - Lipid panel - TSH  5. BPH associated with nocturia  - PSA  6. Need for hepatitis C screening test  - Hep C Antibody  [name], NP

## 2019-02-26 ENCOUNTER — Other Ambulatory Visit: Payer: Self-pay | Admitting: Family Medicine

## 2019-02-26 LAB — PSA: PSA: 1.14 ng/mL (ref 0.10–4.00)

## 2019-02-26 LAB — HEPATITIS C ANTIBODY
Hepatitis C Ab: NONREACTIVE
SIGNAL TO CUT-OFF: 0.01 (ref <1.00)

## 2019-02-26 LAB — TSH: TSH: 0.88 u[IU]/mL (ref 0.35–4.50)

## 2019-02-26 MED ORDER — ATORVASTATIN CALCIUM 40 MG PO TABS: 40.0000 mg | ORAL_TABLET | Freq: Every day | ORAL | 3 refills | Status: DC

## 2019-03-11 ENCOUNTER — Other Ambulatory Visit: Payer: Self-pay | Admitting: Family Medicine

## 2019-03-11 DIAGNOSIS — I10 Essential (primary) hypertension: Secondary | ICD-10-CM

## 2019-03-11 MED ORDER — LISINOPRIL-HYDROCHLOROTHIAZIDE 20-12.5 MG PO TABS: 1.0000 | ORAL_TABLET | Freq: Every day | ORAL | 3 refills | Status: DC

## 2019-03-13 ENCOUNTER — Telehealth: Payer: Self-pay | Admitting: Adult Health

## 2019-03-13 NOTE — Telephone Encounter (Signed)
Copied from [place] [id number]. Topic: Quick Communication - See Telephone Encounter >> Mar 13, 2019  9:55 AM Sheran Luz wrote: CRM for notification. See Telephone encounter for: 03/13/19.  Anderson Malta with [facility] calling to request clarification on atorvastatin (LIPITOR) 40 MG tablet. Anderson Malta states patient was previously prescribed Simvastatin by a different physician.

## 2019-03-13 NOTE — Telephone Encounter (Signed)
Left a message on secure machine informing Brett King that the pt should be taking generic Lipitor 40 mg 1 po qd.  Left call back # if needed.  Will now close message.

## 2019-05-22 ENCOUNTER — Ambulatory Visit: Payer: Medicare Other | Admitting: Adult Health

## 2019-08-18 ENCOUNTER — Other Ambulatory Visit: Payer: Self-pay

## 2019-08-18 ENCOUNTER — Encounter: Payer: Self-pay | Admitting: Adult Health

## 2019-08-18 ENCOUNTER — Ambulatory Visit (INDEPENDENT_AMBULATORY_CARE_PROVIDER_SITE_OTHER): Payer: Medicare Other | Admitting: Adult Health

## 2019-08-18 VITALS — BP 132/80 | Temp 98.1°F | Wt 182.0 lb

## 2019-08-18 DIAGNOSIS — E7800 Pure hypercholesterolemia, unspecified: Secondary | ICD-10-CM

## 2019-08-18 LAB — HEPATIC FUNCTION PANEL
ALT: 109 U/L — ABNORMAL HIGH (ref 0–53)
AST: 65 U/L — ABNORMAL HIGH (ref 0–37)
Albumin: 5 g/dL (ref 3.5–5.2)
Alkaline Phosphatase: 55 U/L (ref 39–117)
Bilirubin, Direct: 0.3 mg/dL (ref 0.0–0.3)
Total Bilirubin: 1 mg/dL (ref 0.2–1.2)
Total Protein: 7.8 g/dL (ref 6.0–8.3)

## 2019-08-18 LAB — LIPID PANEL
Cholesterol: 201 mg/dL — ABNORMAL HIGH (ref 0–200)
HDL: 91.7 mg/dL (ref >39.00)
LDL Cholesterol: 100 mg/dL — ABNORMAL HIGH (ref 0–99)
NonHDL: 108.82
Total CHOL/HDL Ratio: 2
Triglycerides: 46 mg/dL (ref 0.0–149.0)
VLDL: 9.2 mg/dL (ref 0.0–40.0)

## 2019-08-18 NOTE — Progress Notes (Signed)
Subjective:    Patient ID: Brett King, male    DOB: 1951-08-18, 68 y.o.   MRN: [id number]  HPI 68 year old male who  has a past medical history of Hyperlipidemia and Hypertension.  He presents to the office today for follow up regarding hyperlipidemia. When he was seen in February 2020 his cholesterol medication  Was changed from simvastatin to Lipitor   He denies side effects of Lipitor - not had any fatigue or myalgias  He has been staying active and trying to eat eat healthy.    Review of Systems See HPI   Past Medical History:  Diagnosis Date  . Hyperlipidemia   . Hypertension     Social History   Socioeconomic History  . Marital status: Single    Spouse name: Not on file  . Number of children: Not on file  . Years of education: Not on file  . Highest education level: Not on file  Occupational History  . Not on file  Social Needs  . Financial resource strain: Not on file  . Food insecurity    Worry: Not on file    Inability: Not on file  . Transportation needs    Medical: Not on file    Non-medical: Not on file  Tobacco Use  . Smoking status: Former Research scientist (life sciences)  . Smokeless tobacco: Never Used  Substance and Sexual Activity  . Alcohol use: Yes    Comment: ocassionally  . Drug use: No  . Sexual activity: Not on file  Lifestyle  . Physical activity    Days per week: Not on file    Minutes per session: Not on file  . Stress: Not on file  Relationships  . Social Herbalist on phone: Not on file    Gets together: Not on file    Attends religious service: Not on file    Active member of club or organization: Not on file    Attends meetings of clubs or organizations: Not on file    Relationship status: Not on file  . Intimate partner violence    Fear of current or ex partner: Not on file    Emotionally abused: Not on file    Physically abused: Not on file    Forced sexual activity: Not on file  Other Topics Concern  . 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 CHOLECYSTECTOMY      Family History  Problem Relation Age of Onset  . Pneumonia Mother   . Liver cancer Mother   . Pancreatic cancer Mother     No Known Allergies  Current Outpatient Medications on File Prior to Visit  Medication Sig Dispense Refill  . aspirin 325 MG tablet Take 325 mg by mouth daily.      [name] atorvastatin (LIPITOR) 40 MG tablet Take 1 tablet (40 mg total) by mouth daily. 90 tablet 3  . lisinopril-hydrochlorothiazide (PRINZIDE,ZESTORETIC) 20-12.5 MG tablet Take 1 tablet by mouth daily. 90 tablet 3   No current facility-administered medications on file prior to visit.     BP 132/80   Temp 98.1 F (36.7 C)   Wt 182 lb (82.6 kg)   BMI 25.38 kg/m       Objective:   Physical Exam Vitals signs reviewed.  Constitutional:  Appearance: Normal appearance.  Cardiovascular:     Rate and Rhythm: Normal rate and regular rhythm.     Pulses: Normal pulses.     Heart sounds: Normal heart sounds.  Pulmonary:     Effort: Pulmonary effort is normal.     Breath sounds: Normal breath sounds.  Musculoskeletal: Normal range of motion.  Skin:    General: Skin is warm and dry.     Capillary Refill: Capillary refill takes less than 2 seconds.  Neurological:     General: No focal deficit present.     Mental Status: He is alert and oriented to person, place, and time.  Psychiatric:        Mood and Affect: Mood normal.        Behavior: Behavior normal.        Thought Content: Thought content normal.        Judgment: Judgment normal.        Assessment & Plan:  1. Pure hypercholesterolemia - Consider increase in Lipitor  - Lipid panel - Hepatic function panel   [name], NP

## 2019-08-19 ENCOUNTER — Telehealth: Payer: Self-pay | Admitting: Adult Health

## 2019-08-19 NOTE — Telephone Encounter (Signed)
Spoke to patient and informed him of his lab results.  His cholesterol panel has improved but liver enzymes have increased.  He does eat out a lot, most meals include fast food.  We discussed that his statin medication may be causing elevated liver enzymes but more than likely this is diet related.  He was advised to cut out fast food and we will recheck in 3 months.  Liver enzymes continue to be elevated at that time we will get ultrasound of the liver as well as stop his statin medication for a period of time and then retest  He is ok with this plan

## 2019-09-08 DIAGNOSIS — Z23 Encounter for immunization: Secondary | ICD-10-CM | POA: Diagnosis not present

## 2019-10-29 ENCOUNTER — Other Ambulatory Visit: Payer: Self-pay | Admitting: Adult Health

## 2019-10-30 NOTE — Telephone Encounter (Signed)
Sent to the pharmacy by e-scribe. 

## 2019-11-03 ENCOUNTER — Ambulatory Visit: Payer: Medicare Other | Admitting: Adult Health

## 2019-11-25 ENCOUNTER — Other Ambulatory Visit: Payer: Self-pay

## 2020-02-17 ENCOUNTER — Other Ambulatory Visit: Payer: Self-pay | Admitting: Adult Health

## 2020-02-17 DIAGNOSIS — I10 Essential (primary) hypertension: Secondary | ICD-10-CM

## 2020-03-07 ENCOUNTER — Other Ambulatory Visit: Payer: Self-pay

## 2020-03-08 ENCOUNTER — Ambulatory Visit (INDEPENDENT_AMBULATORY_CARE_PROVIDER_SITE_OTHER): Payer: Medicare Other | Admitting: Adult Health

## 2020-03-08 ENCOUNTER — Encounter: Payer: Self-pay | Admitting: Adult Health

## 2020-03-08 VITALS — BP 128/80 | Temp 98.6°F | Ht 71.0 in | Wt 178.0 lb

## 2020-03-08 DIAGNOSIS — Z0000 Encounter for general adult medical examination without abnormal findings: Secondary | ICD-10-CM | POA: Diagnosis not present

## 2020-03-08 DIAGNOSIS — I10 Essential (primary) hypertension: Secondary | ICD-10-CM

## 2020-03-08 DIAGNOSIS — F528 Other sexual dysfunction not due to a substance or known physiological condition: Secondary | ICD-10-CM

## 2020-03-08 DIAGNOSIS — R351 Nocturia: Secondary | ICD-10-CM | POA: Diagnosis not present

## 2020-03-08 DIAGNOSIS — Z23 Encounter for immunization: Secondary | ICD-10-CM

## 2020-03-08 DIAGNOSIS — E7800 Pure hypercholesterolemia, unspecified: Secondary | ICD-10-CM

## 2020-03-08 DIAGNOSIS — N401 Enlarged prostate with lower urinary tract symptoms: Secondary | ICD-10-CM

## 2020-03-08 LAB — CBC WITH DIFFERENTIAL/PLATELET
Basophils Absolute: 0.1 10*3/uL (ref 0.0–0.1)
Basophils Relative: 1 % (ref 0.0–3.0)
Eosinophils Absolute: 0.2 10*3/uL (ref 0.0–0.7)
Eosinophils Relative: 2.6 % (ref 0.0–5.0)
HCT: 45.7 % (ref 39.0–52.0)
Hemoglobin: 15.8 g/dL (ref 13.0–17.0)
Lymphocytes Relative: 13.1 % (ref 12.0–46.0)
Lymphs Abs: 0.8 10*3/uL (ref 0.7–4.0)
MCHC: 34.6 g/dL (ref 30.0–36.0)
MCV: 96.7 fl (ref 78.0–100.0)
Monocytes Absolute: 0.6 10*3/uL (ref 0.1–1.0)
Monocytes Relative: 10.3 % (ref 3.0–12.0)
Neutro Abs: 4.4 10*3/uL (ref 1.4–7.7)
Neutrophils Relative %: 73 % (ref 43.0–77.0)
Platelets: 210 10*3/uL (ref 150.0–400.0)
RBC: 4.72 Mil/uL (ref 4.22–5.81)
RDW: 12.9 % (ref 11.5–15.5)
WBC: 6.1 10*3/uL (ref 4.0–10.5)

## 2020-03-08 LAB — TSH: TSH: 1.2 u[IU]/mL (ref 0.35–4.50)

## 2020-03-08 LAB — COMPREHENSIVE METABOLIC PANEL
ALT: 87 U/L — ABNORMAL HIGH (ref 0–53)
AST: 66 U/L — ABNORMAL HIGH (ref 0–37)
Albumin: 4.6 g/dL (ref 3.5–5.2)
Alkaline Phosphatase: 62 U/L (ref 39–117)
BUN: 20 mg/dL (ref 6–23)
CO2: 28 mEq/L (ref 19–32)
Calcium: 10.7 mg/dL — ABNORMAL HIGH (ref 8.4–10.5)
Chloride: 97 mEq/L (ref 96–112)
Creatinine, Ser: 1.26 mg/dL (ref 0.40–1.50)
GFR: 56.73 mL/min — ABNORMAL LOW (ref >60.00)
Glucose, Bld: 108 mg/dL — ABNORMAL HIGH (ref 70–99)
Potassium: 5 mEq/L (ref 3.5–5.1)
Sodium: 134 mEq/L — ABNORMAL LOW (ref 135–145)
Total Bilirubin: 0.8 mg/dL (ref 0.2–1.2)
Total Protein: 7.5 g/dL (ref 6.0–8.3)

## 2020-03-08 LAB — LIPID PANEL
Cholesterol: 204 mg/dL — ABNORMAL HIGH (ref 0–200)
HDL: 91.8 mg/dL (ref >39.00)
LDL Cholesterol: 101 mg/dL — ABNORMAL HIGH (ref 0–99)
NonHDL: 112.62
Total CHOL/HDL Ratio: 2
Triglycerides: 60 mg/dL (ref 0.0–149.0)
VLDL: 12 mg/dL (ref 0.0–40.0)

## 2020-03-08 LAB — PSA: PSA: 1.14 ng/mL (ref 0.10–4.00)

## 2020-03-08 NOTE — Patient Instructions (Signed)
It was great seeing you today!   Keep working on heart healthy diet and exercise  We will follow up with you regarding your blood work   COVID-19 Vaccine Information can be found at: [email or web address] For questions related to vaccine distribution or appointments, please email [email or web address] or call [phone].

## 2020-03-08 NOTE — Progress Notes (Addendum)
Subjective:    Patient ID: Brett King, male    DOB: [birth year], 69 y.o.   MRN: [id number]  HPI Patient presents for yearly preventative medicine examination. He is a pleasant 69 year old male who  has a past medical history of Hyperlipidemia and Hypertension.  Hyperlipidemia -early prescribed Lipitor 40 mg and aspirin 325 mg daily.  He denies myalgia or fatigue  Lab Results  Component Value Date   CHOL 201 (H) 08/18/2019   CHOL 232 (H) 02/25/2019   CHOL 233 (H) 02/24/2018   Lab Results  Component Value Date   HDL 91.70 08/18/2019   HDL 108.90 02/25/2019   HDL 77.70 02/24/2018   Lab Results  Component Value Date   LDLCALC 100 (H) 08/18/2019   LDLCALC 112 (H) 02/25/2019   LDLCALC 141 (H) 02/24/2018   Lab Results  Component Value Date   TRIG 46.0 08/18/2019   TRIG 58.0 02/25/2019   TRIG 71.0 02/24/2018   Lab Results  Component Value Date   CHOLHDL 2 08/18/2019   CHOLHDL 2 02/25/2019   CHOLHDL 3 02/24/2018   Lab Results  Component Value Date   LDLDIRECT 124.1 10/27/2013   LDLDIRECT 115.2 11/14/2012   LDLDIRECT 147.4 11/19/2011   Hypertension -currently prescribed lisinopril/hydrochlorothiazide 20-12.5 mg.  He denies dizziness, lightheadedness, chest pain, or shortness of breath. He does check his BP at home and reports readings in the 120-130's/80.  BP Readings from Last 3 Encounters:  03/08/20 128/80  08/18/19 132/80  02/25/19 138/80    All immunizations and health maintenance protocols were reviewed with the patient and needed orders were placed. He is due for his PPSV 23.   Appropriate screening laboratory values were ordered for the patient including screening of hyperlipidemia, renal function and hepatic function. If indicated by BPH, a PSA was ordered.  Medication reconciliation,  past medical history, social history, problem list and allergies were reviewed in detail with the patient  Goals were established with regard to weight loss, exercise, and   diet in compliance with medications. He has been eating healthy and exercising around the house.   Wt Readings from Last 3 Encounters:  03/08/20 178 lb (80.7 kg)  08/18/19 182 lb (82.6 kg)  02/25/19 182 lb (82.6 kg)    End of life planning was discussed.  He is up-to-date on routine screening colonoscopies  He has no acute issues today    Review of Systems  Constitutional: Negative.   HENT: Negative.   Eyes: Negative.   Respiratory: Negative.   Cardiovascular: Negative.   Gastrointestinal: Negative.   Endocrine: Negative.   Genitourinary: Negative.   Musculoskeletal: Negative.   Skin: Negative.   Allergic/Immunologic: Negative.   Neurological: Negative.   Hematological: Negative.   Psychiatric/Behavioral: Negative.   All other systems reviewed and are negative.  Past Medical History:  Diagnosis Date  . Hyperlipidemia   . Hypertension     Social History   Socioeconomic History  . Marital status: Single    Spouse name: Not on file  . Number of children: Not on file  . Years of education: Not on file  . Highest education level: Not on file  Occupational History  . Not on file  Tobacco Use  . Smoking status: Former Research scientist (life sciences)  . Smokeless tobacco: Never Used  Substance and Sexual Activity  . Alcohol use: Yes    Comment: ocassionally  . Drug use: No  . Sexual activity: Not on file  Other Topics Concern  . 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 Difficulty of Paying Living Expenses: Not on file  Food Insecurity:   . Worried About Charity fundraiser in the Last Year: Not on file  . Ran Out of Food in the Last Year: Not on file  Transportation Needs:   . Lack of Transportation (Medical): Not on file  . Lack of Transportation (Non-Medical): Not on file  Physical Activity:   . Days of Exercise per  Week: Not on file  . Minutes of Exercise per Session: Not on file  Stress:   . Feeling of Stress : Not on file  Social Connections:   . Frequency of Communication with Friends and Family: Not on file  . Frequency of Social Gatherings with Friends and Family: Not on file  . Attends Religious Services: Not on file  . Active Member of Clubs or Organizations: Not on file  . Attends Archivist Meetings: Not on file  . Marital Status: Not on file  Intimate Partner Violence:   . Fear of Current or Ex-Partner: Not on file  . Emotionally Abused: Not on file  . Physically Abused: Not on file  . Sexually Abused: Not on file    Past Surgical History:  Procedure Laterality Date  . CHOLECYSTECTOMY      Family History  Problem Relation Age of Onset  . Pneumonia Mother   . Liver cancer Mother   . Pancreatic cancer Mother     No Known Allergies  Current Outpatient Medications on File Prior to Visit  Medication Sig Dispense Refill  . aspirin 325 MG tablet Take 325 mg by mouth daily.      [name] atorvastatin (LIPITOR) 40 MG tablet Take 1 tablet by mouth once daily 90 tablet 2  . lisinopril-hydrochlorothiazide (ZESTORETIC) 20-12.5 MG tablet Take 1 tablet by mouth daily 90 tablet 2  . sildenafil (REVATIO) 20 MG tablet TAKE 2-5 TABLETS BY MOUTH 30 MINUTES PRIOR TO INTERCOURSE (Patient not taking: Reported on 03/08/2020) 50 tablet 3   No current facility-administered medications on file prior to visit.    BP 128/80   Temp 98.6 F (37 C)   Ht 5\' 11"  (1.803 m)   Wt 178 lb (80.7 kg)   BMI 24.83 kg/m       Objective:   Physical Exam Vitals and nursing note reviewed.  Constitutional:      General: He is not in acute distress.    Appearance: Normal appearance. He is well-developed and overweight. He is not diaphoretic.  HENT:     Head: Normocephalic and atraumatic.     Right Ear: Tympanic membrane, ear canal and external ear normal. There is no impacted cerumen.     Left Ear:  Tympanic membrane, ear canal and external ear normal. There is no impacted cerumen.     Nose: Nose normal. No congestion or rhinorrhea.     Mouth/Throat:     Mouth: Mucous membranes are moist.     Pharynx: Oropharynx is clear. No oropharyngeal exudate.  Eyes:     General:        Right eye: No discharge.        Left eye: No discharge.     Conjunctiva/sclera: Conjunctivae normal.     Pupils: Pupils are equal, round, and reactive to light.  Neck:     Thyroid: No thyromegaly.     Trachea: No tracheal deviation.  Cardiovascular:     Rate and Rhythm: Normal rate and regular rhythm.     Pulses: Normal pulses.     Heart sounds: Normal heart sounds. No murmur. No friction rub. No gallop.   Pulmonary:     Effort: Pulmonary effort is normal. No respiratory distress.     Breath sounds: Normal breath sounds. No stridor. No wheezing, rhonchi or rales.  Chest:     Chest wall: No tenderness.  Abdominal:     General: Bowel sounds are normal. There is no distension.     Palpations: Abdomen is soft. There is no mass.     Tenderness: There is no abdominal tenderness. There is no right CVA tenderness, left CVA tenderness, guarding or rebound.     Hernia: No hernia is present.  Musculoskeletal:        General: No swelling, tenderness, deformity or signs of injury. Normal range of motion.     Right lower leg: No edema.     Left lower leg: No edema.  Lymphadenopathy:     Cervical: No cervical adenopathy.  Skin:    General: Skin is warm and dry.     Capillary Refill: Capillary refill takes less than 2 seconds.     Coloration: Skin is not jaundiced or pale.     Findings: No bruising, erythema, lesion or rash.  Neurological:     General: No focal deficit present.     Mental Status: He is alert and oriented to person, place, and time.     Cranial Nerves: No cranial nerve deficit.     Sensory: No sensory deficit.     Motor: No weakness.     Coordination: Coordination normal.     Gait: Gait normal.       Deep Tendon Reflexes: Reflexes normal.  Psychiatric:        Mood and Affect: Mood normal.        Behavior: Behavior normal.        Thought Content: Thought content normal.        Judgment: Judgment normal.       Assessment & Plan:    1. Essential hypertension - No change in medications  - CBC with Differential/Platelet - Comprehensive metabolic panel - Lipid panel - TSH  2. Pure hypercholesterolemia - Continue with lipitor. Consider dose change  - CBC with Differential/Platelet - Comprehensive metabolic panel - Lipid panel - TSH  3. ERECTILE DYSFUNCTION, MILD  - CBC with Differential/Platelet - Comprehensive metabolic panel - Lipid panel - TSH  4. BPH associated with nocturia  - PSA  [name], NP

## 2020-03-08 NOTE — Addendum Note (Signed)
Addended by: Miles Costain T on: 03/08/2020 08:35 AM   Modules accepted: Orders

## 2020-03-09 ENCOUNTER — Other Ambulatory Visit: Payer: Self-pay | Admitting: Family Medicine

## 2020-03-09 DIAGNOSIS — E7800 Pure hypercholesterolemia, unspecified: Secondary | ICD-10-CM

## 2020-03-09 MED ORDER — ATORVASTATIN CALCIUM 80 MG PO TABS: 80.0000 mg | ORAL_TABLET | Freq: Every day | ORAL | 3 refills | Status: DC

## 2020-03-21 ENCOUNTER — Ambulatory Visit
Admission: RE | Admit: 2020-03-21 | Discharge: 2020-03-21 | Disposition: A | Discharge status: Home / Self Care | Payer: Medicare Other | Source: Ambulatory Visit | Attending: Adult Health | Admitting: Adult Health

## 2020-03-21 DIAGNOSIS — K7689 Other specified diseases of liver: Secondary | ICD-10-CM | POA: Diagnosis not present

## 2020-03-21 DIAGNOSIS — E7800 Pure hypercholesterolemia, unspecified: Secondary | ICD-10-CM

## 2020-03-29 ENCOUNTER — Encounter: Payer: Self-pay | Admitting: Family Medicine

## 2020-04-04 ENCOUNTER — Telehealth: Payer: Self-pay | Admitting: Adult Health

## 2020-04-04 NOTE — Telephone Encounter (Signed)
The patient called wanting to know why he received a bill for his Physical on 03/08/2020.  Please contact the patient on an update.

## 2020-04-04 NOTE — Telephone Encounter (Signed)
Pt called in regards to letter he received about Misty trying to reach him. He would like a call back on home phone to go over results. Pt also wanted to advised that the home phone would be the best number to reach him on.

## 2020-04-06 NOTE — Telephone Encounter (Signed)
Tried returning call to home #.  No answer or machine.  Heard a sound that was like a fax machine or dial up internet.

## 2020-04-08 NOTE — Telephone Encounter (Signed)
It looks like I might have coded it for his prior insurance. It has been fixed. He can resubmit it

## 2020-04-08 NOTE — Telephone Encounter (Signed)
Spoke to the pt and informed him of ultrasound results.  Pt would like to see if Tommi Rumps can use other codes for his physical.  Pt was left with a bill that is over $300.  Advised that I will call him back when/if Tommi Rumps is able to use different codes.  Will forward to [facility] for review.

## 2020-04-08 NOTE — Telephone Encounter (Signed)
Brett King, can you resubmit this claim to the insurance?  Please let me know if you are able to do this so I can inform the pt.

## 2020-04-11 NOTE — Telephone Encounter (Signed)
I am going to send to [facility] to see if this can be resubmitted. Thank you

## 2020-04-12 NOTE — Telephone Encounter (Signed)
Tried reaching pt on home #.  No answer or machine.  Left a message on cell for a return call.

## 2020-04-12 NOTE — Telephone Encounter (Signed)
Pt notified by front desk staff when he returned call.  Nothing further needed.

## 2020-09-07 DIAGNOSIS — Z23 Encounter for immunization: Secondary | ICD-10-CM | POA: Diagnosis not present

## 2020-09-20 ENCOUNTER — Other Ambulatory Visit: Payer: Self-pay

## 2020-09-20 ENCOUNTER — Ambulatory Visit (INDEPENDENT_AMBULATORY_CARE_PROVIDER_SITE_OTHER): Payer: Medicare Other

## 2020-09-20 DIAGNOSIS — Z0000 Encounter for general adult medical examination without abnormal findings: Secondary | ICD-10-CM

## 2020-09-20 NOTE — Progress Notes (Signed)
Subjective:   Brett King is a 69 y.o. male who presents for an Initial Medicare Annual Wellness Visit.  I connected with Wynelle Fanny today by telephone and verified that I am speaking with the correct person using two identifiers. Location patient: home Location provider: work Persons participating in the virtual visit: patient, provider.   I discussed the limitations, risks, security and privacy concerns of performing an evaluation and management service by telephone and the availability of in person appointments. I also discussed with the patient that there may be a patient responsible charge related to this service. The patient expressed understanding and verbally consented to this telephonic visit.    Interactive audio and video telecommunications were attempted between this provider and patient, however failed, due to patient having technical difficulties OR patient did not have access to video capability.  We continued and completed visit with audio only.     Review of Systems    N/A Cardiac Risk Factors include: advanced age (>2men, >78 women);male gender;hypertension     Objective:    There were no vitals filed for this visit. There is no height or weight on file to calculate BMI.  Advanced Directives 09/20/2020  Does Patient Have a Medical Advance Directive? Yes  Type of Advance Directive Living will  Does patient want to make changes to medical advance directive? No - Patient declined    Current Medications (verified) Outpatient Encounter Medications as of 09/20/2020  Medication Sig  . aspirin 325 MG tablet Take 325 mg by mouth daily.    [name] atorvastatin (LIPITOR) 80 MG tablet Take 1 tablet (80 mg total) by mouth daily.  [name] lisinopril-hydrochlorothiazide (ZESTORETIC) 20-12.5 MG tablet Take 1 tablet by mouth daily  . VITAMIN D, CHOLECALCIFEROL, PO Take by mouth.  . sildenafil (REVATIO) 20 MG tablet TAKE 2-5 TABLETS BY MOUTH 30 MINUTES PRIOR TO INTERCOURSE (Patient not  taking: Reported on 03/08/2020)   No facility-administered encounter medications on file as of 09/20/2020.    Allergies (verified) Patient has no known allergies.   History: Past Medical History:  Diagnosis Date  . Hyperlipidemia   . Hypertension    Past Surgical History:  Procedure Laterality Date  . CHOLECYSTECTOMY     Family History  Problem Relation Age of Onset  . Pneumonia Mother   . Liver cancer Mother   . Pancreatic cancer Mother    Social History   Socioeconomic History  . Marital status: Single    Spouse name: Not on file  . Number of children: Not on file  . Years of education: Not on file  . Highest education level: Not on file  Occupational History  . Not on file  Tobacco Use  . Smoking status: Former Research scientist (life sciences)  . Smokeless tobacco: Never Used  Substance and Sexual Activity  . Alcohol use: Yes    Comment: ocassionally  . Drug use: No  . Sexual activity: Not on file  Other Topics Concern  . 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 Difficulty of Paying Living Expenses: Not hard at all  Food Insecurity: No Food Insecurity  . Worried About Charity fundraiser in the Last Year: Never true  . Ran Out of Food in the Last Year: Never true  Transportation Needs: No Transportation Needs  .  Lack of Transportation (Medical): No  . Lack of Transportation (Non-Medical): No  Physical Activity: Insufficiently Active  . Days of Exercise per Week: 4 days  . Minutes of Exercise per Session: 30 min  Stress: No Stress Concern Present  . Feeling of Stress : Not at all  Social Connections: Socially Isolated  . Frequency of Communication with Friends and Family: Once a week  . Frequency of Social Gatherings with Friends and Family: Once a week  . Attends Religious Services: Never  . Active Member of  Clubs or Organizations: No  . Attends Archivist Meetings: Never  . Marital Status: Widowed    Tobacco Counseling Counseling given: Not Answered   Clinical Intake:  Pre-visit preparation completed: Yes  Pain : No/denies pain     Nutritional Risks: None Diabetes: No  How often do you need to have someone help you when you read instructions, pamphlets, or other written materials from your doctor or pharmacy?: 1 - Never What is the last grade level you completed in school?: Some College  Diabetic?No  Interpreter Needed?: No  Information entered by :: [place] of Daily Living In your present state of health, do you have any difficulty performing the following activities: 09/20/2020  Hearing? N  Vision? N  Difficulty concentrating or making decisions? N  Walking or climbing stairs? N  Dressing or bathing? N  Doing errands, shopping? N  Preparing Food and eating ? N  Using the Toilet? N  In the past six months, have you accidently leaked urine? N  Do you have problems with loss of bowel control? N  Managing your Medications? N  Managing your Finances? N  Housekeeping or managing your Housekeeping? N  Some recent data might be hidden    Patient Care Team: [name], NP as PCP - General (Family Medicine)  Indicate any recent Medical Services you may have received from other than [facility] providers in the past year (date may be approximate).     Assessment:   This is a routine wellness examination for Brett King.  Hearing/Vision screen  Hearing Screening   125Hz  250Hz  500Hz  1000Hz  2000Hz  3000Hz  4000Hz  6000Hz  8000Hz   Right ear:           Left ear:           Vision Screening Comments: Patient states gets eyes checked every other year  Dietary issues and exercise activities discussed: Current Exercise Habits: Home exercise routine, Type of exercise: walking;strength training/weights, Time (Minutes): 30, Frequency (Times/Week): 4, Weekly  Exercise (Minutes/Week): 120, Intensity: Mild  Goals    . Patient Stated     I will continue to lift weights and walk       Depression Screen PHQ 2/9 Scores 09/20/2020 02/19/2017 02/14/2016  PHQ - 2 Score 0 0 0  PHQ- 9 Score 0 - -    Fall Risk Fall Risk  09/20/2020 11/25/2019 11/19/2018 02/19/2017 02/14/2016  Falls in the past year? 0 0 0 No No  Comment - Emmi Telephone Survey: data to providers prior to load Emmi Telephone Survey: data to providers prior to load - -  Number falls in past yr: 0 - - - -  Injury with Fall? 0 - - - -  Risk for fall due to : No Fall Risks - - - -  Follow up Falls evaluation completed;Falls prevention discussed - - - -    Any stairs in or around the home? No  If so, are there any without  handrails? No  Home free of loose throw rugs in walkways, pet beds, electrical cords, etc? Yes  Adequate lighting in your home to reduce risk of falls? Yes   ASSISTIVE DEVICES UTILIZED TO PREVENT FALLS:  Life alert? No  Use of a cane, walker or w/c? No  Grab bars in the bathroom? No  Shower chair or bench in shower? No  Elevated toilet seat or a handicapped toilet? No    Cognitive Function:  Cognitive screening not indicated based on direct observation      Immunizations Immunization History  Administered Date(s) Administered  . Fluad Quad(high Dose 65+) 09/08/2019  . Influenza Split 10/14/2013  . Influenza Whole 10/01/2007, 09/30/2009, 10/17/2010, 11/01/2011  . Influenza, High Dose Seasonal PF 02/19/2017, 09/30/2018  . Influenza, Seasonal, Injecte, Preservative Fre 09/08/2019  . Influenza-Unspecified 12/25/2014, 01/10/2016  . Janssen (J&J) SARS-COV-2 Vaccination 04/03/2020  . Pneumococcal Conjugate-13 02/14/2016  . Pneumococcal Polysaccharide-23 01/01/2004, 11/20/2012, 03/08/2020  . Td 12/31/1996, 11/24/2007  . Tdap 02/24/2018    TDAP status: Up to date Flu Vaccine status: Up to date Pneumococcal vaccine status: Up to date Covid-19 vaccine status:  Completed vaccines  Qualifies for Shingles Vaccine? Yes   Zostavax completed No   Shingrix Completed?: No.    Education has been provided regarding the importance of this vaccine. Patient has been advised to call insurance company to determine out of pocket expense if they have not yet received this vaccine. Advised may also receive vaccine at local pharmacy or Health Dept. Verbalized acceptance and understanding.  Screening Tests Health Maintenance  Topic Date Due  . INFLUENZA VACCINE  07/31/2020  . COLONOSCOPY  01/30/2021  . TETANUS/TDAP  02/25/2028  . COVID-19 Vaccine  Completed  . Hepatitis C Screening  Completed  . PNA vac Low Risk Adult  Completed    Health Maintenance  Health Maintenance Due  Topic Date Due  . INFLUENZA VACCINE  07/31/2020    Colorectal cancer screening: Completed 01/30/2011. Repeat every 10 years  Lung Cancer Screening: (Low Dose CT Chest recommended if Age 26-80 years, 30 pack-year currently smoking OR have quit w/in 15years.) does not qualify.   Lung Cancer Screening Referral: N/A  Additional Screening:  Hepatitis C Screening: does qualify; Completed 02/25/2019  Vision Screening: Recommended annual ophthalmology exams for early detection of glaucoma and other disorders of the eye. Is the patient up to date with their annual eye exam?  No  Who is the provider or what is the name of the office in which the patient attends annual eye exams? [facility]  If pt is not established with a provider, would they like to be referred to a provider to establish care? No .   Dental Screening: Recommended annual dental exams for proper oral hygiene  Community Resource Referral / Chronic Care Management: CRR required this visit?  No   CCM required this visit?  No      Plan:     I have personally reviewed and noted the following in the patient's chart:   . Medical and social history . Use of alcohol, tobacco or illicit drugs  . Current  medications and supplements . Functional ability and status . Nutritional status . Physical activity . Advanced directives . List of other physicians . Hospitalizations, surgeries, and ER visits in previous 12 months . Vitals . Screenings to include cognitive, depression, and falls . Referrals and appointments  In addition, I have reviewed and discussed with patient certain preventive protocols, quality metrics, and best practice  recommendations. A written personalized care plan for preventive services as well as general preventive health recommendations were provided to patient.     Ofilia Neas, LPN   04/22/7022   Nurse Notes: None

## 2020-09-20 NOTE — Patient Instructions (Signed)
Mr. Brett King , Thank you for taking time to come for your Medicare Wellness Visit. I appreciate your ongoing commitment to your health goals. Please review the following plan we discussed and let me know if I can assist you in the future.   Screening recommendations/referrals: Colonoscopy: Up to date, next due 01/30/2021 Recommended yearly ophthalmology/optometry visit for glaucoma screening and checkup Recommended yearly dental visit for hygiene and checkup  Vaccinations: Influenza vaccine: Up to date, next due 2022-2023 Pneumococcal vaccine: Completed series  Tdap vaccine: Up to date, 02/25/2028 Shingles vaccine: Currently due for Shingrix, please contact your pharmacy to discuss cost and to receive vaccine series    Advanced directives: Please bring in a copy of your advanced directives so that we may scan into your chart   Conditions/risks identified: None   Next appointment: 03/09/2021 @ 7:00 am with [name], NP for a Physical  Preventive Care 69 Years and Older, Male Preventive care refers to lifestyle choices and visits with your health care provider that can promote health and wellness. What does preventive care include?  A yearly physical exam. This is also called an annual well check.  Dental exams once or twice a year.  Routine eye exams. Ask your health care provider how often you should have your eyes checked.  Personal lifestyle choices, including:  Daily care of your teeth and gums.  Regular physical activity.  Eating a healthy diet.  Avoiding tobacco and drug use.  Limiting alcohol use.  Practicing safe sex.  Taking low doses of aspirin every day.  Taking vitamin and mineral supplements as recommended by your health care provider. What happens during an annual well check? The services and screenings done by your health care provider during your annual well check will depend on your age, overall health, lifestyle risk factors, and family history of  disease. Counseling  Your health care provider may ask you questions about your:  Alcohol use.  Tobacco use.  Drug use.  Emotional well-being.  Home and relationship well-being.  Sexual activity.  Eating habits.  History of falls.  Memory and ability to understand (cognition).  Work and work Statistician. Screening  You may have the following tests or measurements:  Height, weight, and BMI.  Blood pressure.  Lipid and cholesterol levels. These may be checked every 5 years, or more frequently if you are over 69 years old.  Skin check.  Lung cancer screening. You may have this screening every year starting at age 81 if you have a 30-pack-year history of smoking and currently smoke or have quit within the past 15 years.  Fecal occult blood test (FOBT) of the stool. You may have this test every year starting at age 52.  Flexible sigmoidoscopy or colonoscopy. You may have a sigmoidoscopy every 5 years or a colonoscopy every 10 years starting at age 75.  Prostate cancer screening. Recommendations will vary depending on your family history and other risks.  Hepatitis C blood test.  Hepatitis B blood test.  Sexually transmitted disease (STD) testing.  Diabetes screening. This is done by checking your blood sugar (glucose) after you have not eaten for a while (fasting). You may have this done every 1-3 years.  Abdominal aortic aneurysm (AAA) screening. You may need this if you are a current or former smoker.  Osteoporosis. You may be screened starting at age 80 if you are at high risk. Talk with your health care provider about your test results, treatment options, and if necessary, the need for  more tests. Vaccines  Your health care provider may recommend certain vaccines, such as:  Influenza vaccine. This is recommended every year.  Tetanus, diphtheria, and acellular pertussis (Tdap, Td) vaccine. You may need a Td booster every 10 years.  Zoster vaccine. You may  need this after age 24.  Pneumococcal 13-valent conjugate (PCV13) vaccine. One dose is recommended after age 43.  Pneumococcal polysaccharide (PPSV23) vaccine. One dose is recommended after age 47. Talk to your health care provider about which screenings and vaccines you need and how often you need them. This information is not intended to replace advice given to you by your health care provider. Make sure you discuss any questions you have with your health care provider. Document Released: 01/13/2016 Document Revised: 09/05/2016 Document Reviewed: 10/18/2015 Elsevier Interactive Patient Education  2017 [place] Prevention in the Home Falls can cause injuries. They can happen to people of all ages. There are many things you can do to make your home safe and to help prevent falls. What can I do on the outside of my home?  Regularly fix the edges of walkways and driveways and fix any cracks.  Remove anything that might make you trip as you walk through a door, such as a raised step or threshold.  Trim any bushes or trees on the path to your home.  Use bright outdoor lighting.  Clear any walking paths of anything that might make someone trip, such as rocks or tools.  Regularly check to see if handrails are loose or broken. Make sure that both sides of any steps have handrails.  Any raised decks and porches should have guardrails on the edges.  Have any leaves, snow, or ice cleared regularly.  Use sand or salt on walking paths during winter.  Clean up any spills in your garage right away. This includes oil or grease spills. What can I do in the bathroom?  Use night lights.  Install grab bars by the toilet and in the tub and shower. Do not use towel bars as grab bars.  Use non-skid mats or decals in the tub or shower.  If you need to sit down in the shower, use a plastic, non-slip stool.  Keep the floor dry. Clean up any water that spills on the floor as soon as it  happens.  Remove soap buildup in the tub or shower regularly.  Attach bath mats securely with double-sided non-slip rug tape.  Do not have throw rugs and other things on the floor that can make you trip. What can I do in the bedroom?  Use night lights.  Make sure that you have a light by your bed that is easy to reach.  Do not use any sheets or blankets that are too big for your bed. They should not hang down onto the floor.  Have a firm chair that has side arms. You can use this for support while you get dressed.  Do not have throw rugs and other things on the floor that can make you trip. What can I do in the kitchen?  Clean up any spills right away.  Avoid walking on wet floors.  Keep items that you use a lot in easy-to-reach places.  If you need to reach something above you, use a strong step stool that has a grab bar.  Keep electrical cords out of the way.  Do not use floor polish or wax that makes floors slippery. If you must use wax, use non-skid  floor wax.  Do not have throw rugs and other things on the floor that can make you trip. What can I do with my stairs?  Do not leave any items on the stairs.  Make sure that there are handrails on both sides of the stairs and use them. Fix handrails that are broken or loose. Make sure that handrails are as long as the stairways.  Check any carpeting to make sure that it is firmly attached to the stairs. Fix any carpet that is loose or worn.  Avoid having throw rugs at the top or bottom of the stairs. If you do have throw rugs, attach them to the floor with carpet tape.  Make sure that you have a light switch at the top of the stairs and the bottom of the stairs. If you do not have them, ask someone to add them for you. What else can I do to help prevent falls?  Wear shoes that:  Do not have high heels.  Have rubber bottoms.  Are comfortable and fit you well.  Are closed at the toe. Do not wear sandals.  If you  use a stepladder:  Make sure that it is fully opened. Do not climb a closed stepladder.  Make sure that both sides of the stepladder are locked into place.  Ask someone to hold it for you, if possible.  Clearly mark and make sure that you can see:  Any grab bars or handrails.  First and last steps.  Where the edge of each step is.  Use tools that help you move around (mobility aids) if they are needed. These include:  Canes.  Walkers.  Scooters.  Crutches.  Turn on the lights when you go into a dark area. Replace any light bulbs as soon as they burn out.  Set up your furniture so you have a clear path. Avoid moving your furniture around.  If any of your floors are uneven, fix them.  If there are any pets around you, be aware of where they are.  Review your medicines with your doctor. Some medicines can make you feel dizzy. This can increase your chance of falling. Ask your doctor what other things that you can do to help prevent falls. This information is not intended to replace advice given to you by your health care provider. Make sure you discuss any questions you have with your health care provider. Document Released: 10/13/2009 Document Revised: 05/24/2016 Document Reviewed: 01/21/2015 Elsevier Interactive Patient Education  2017 [employer].

## 2020-11-29 ENCOUNTER — Other Ambulatory Visit: Payer: Self-pay | Admitting: Adult Health

## 2020-12-05 ENCOUNTER — Telehealth: Payer: Self-pay | Admitting: Adult Health

## 2020-12-05 NOTE — Telephone Encounter (Signed)
Pt is calling in stating that he is looking for a new insurance carrier and medication coverage [place]?? Agent Reina Fuse [phone].  Agent is needing to see if the pt is taking atorvastatin (LIPITOR) 80 MG and lisinopril-hydrochlorothiazide (ZESTORETIC) 20-12.5 MG .

## 2020-12-05 NOTE — Telephone Encounter (Signed)
Attempted to reach patient but no answer or voicemail.

## 2020-12-07 ENCOUNTER — Other Ambulatory Visit: Payer: Self-pay

## 2020-12-07 DIAGNOSIS — I10 Essential (primary) hypertension: Secondary | ICD-10-CM

## 2020-12-07 MED ORDER — LISINOPRIL-HYDROCHLOROTHIAZIDE 20-12.5 MG PO TABS: 1.0000 | ORAL_TABLET | Freq: Every day | ORAL | 2 refills | Status: DC

## 2020-12-07 MED ORDER — ATORVASTATIN CALCIUM 80 MG PO TABS: 80.0000 mg | ORAL_TABLET | Freq: Every day | ORAL | 3 refills | Status: DC

## 2020-12-07 NOTE — Telephone Encounter (Signed)
Spoke with patient who states that he have changed insurance and the current company needs the medications that he's taking. Spoke with Agent Leverman who gave the fax number to send over requested prescriptions. Then faxed over.

## 2021-01-18 ENCOUNTER — Other Ambulatory Visit: Payer: Self-pay | Admitting: Family Medicine

## 2021-01-18 DIAGNOSIS — I10 Essential (primary) hypertension: Secondary | ICD-10-CM

## 2021-01-18 MED ORDER — LISINOPRIL-HYDROCHLOROTHIAZIDE 20-12.5 MG PO TABS: 1.0000 | ORAL_TABLET | Freq: Every day | ORAL | 0 refills | Status: DC

## 2021-01-18 MED ORDER — ATORVASTATIN CALCIUM 80 MG PO TABS: 80.0000 mg | ORAL_TABLET | Freq: Every day | ORAL | 0 refills | Status: DC

## 2021-01-18 NOTE — Telephone Encounter (Signed)
Faxed to [facility] per pt.  Nothing further needed.

## 2021-03-03 ENCOUNTER — Other Ambulatory Visit: Payer: Self-pay | Admitting: *Deleted

## 2021-03-03 DIAGNOSIS — I10 Essential (primary) hypertension: Secondary | ICD-10-CM

## 2021-03-03 MED ORDER — LISINOPRIL-HYDROCHLOROTHIAZIDE 20-12.5 MG PO TABS: 1.0000 | ORAL_TABLET | Freq: Every day | ORAL | 0 refills | Status: DC

## 2021-03-03 MED ORDER — ATORVASTATIN CALCIUM 80 MG PO TABS: 80.0000 mg | ORAL_TABLET | Freq: Every day | ORAL | 0 refills | Status: DC

## 2021-03-03 NOTE — Telephone Encounter (Signed)
Rx done. 

## 2021-03-08 ENCOUNTER — Other Ambulatory Visit: Payer: Self-pay

## 2021-03-09 ENCOUNTER — Ambulatory Visit (INDEPENDENT_AMBULATORY_CARE_PROVIDER_SITE_OTHER): Payer: Medicare Other | Admitting: Adult Health

## 2021-03-09 ENCOUNTER — Encounter: Payer: Self-pay | Admitting: Adult Health

## 2021-03-09 VITALS — BP 130/86 | HR 92 | Temp 98.4°F | Ht 71.0 in | Wt 155.0 lb

## 2021-03-09 DIAGNOSIS — I10 Essential (primary) hypertension: Secondary | ICD-10-CM | POA: Diagnosis not present

## 2021-03-09 DIAGNOSIS — Z1211 Encounter for screening for malignant neoplasm of colon: Secondary | ICD-10-CM

## 2021-03-09 DIAGNOSIS — R351 Nocturia: Secondary | ICD-10-CM

## 2021-03-09 DIAGNOSIS — F528 Other sexual dysfunction not due to a substance or known physiological condition: Secondary | ICD-10-CM | POA: Diagnosis not present

## 2021-03-09 DIAGNOSIS — N401 Enlarged prostate with lower urinary tract symptoms: Secondary | ICD-10-CM | POA: Diagnosis not present

## 2021-03-09 DIAGNOSIS — E7800 Pure hypercholesterolemia, unspecified: Secondary | ICD-10-CM

## 2021-03-09 LAB — CBC WITH DIFFERENTIAL/PLATELET
Basophils Absolute: 0.1 10*3/uL (ref 0.0–0.1)
Basophils Relative: 1 % (ref 0.0–3.0)
Eosinophils Absolute: 0.2 10*3/uL (ref 0.0–0.7)
Eosinophils Relative: 4.5 % (ref 0.0–5.0)
HCT: 43.3 % (ref 39.0–52.0)
Hemoglobin: 14.7 g/dL (ref 13.0–17.0)
Lymphocytes Relative: 13.2 % (ref 12.0–46.0)
Lymphs Abs: 0.7 10*3/uL (ref 0.7–4.0)
MCHC: 34 g/dL (ref 30.0–36.0)
MCV: 93.1 fl (ref 78.0–100.0)
Monocytes Absolute: 0.5 10*3/uL (ref 0.1–1.0)
Monocytes Relative: 9.9 % (ref 3.0–12.0)
Neutro Abs: 3.8 10*3/uL (ref 1.4–7.7)
Neutrophils Relative %: 71.4 % (ref 43.0–77.0)
Platelets: 213 10*3/uL (ref 150.0–400.0)
RBC: 4.65 Mil/uL (ref 4.22–5.81)
RDW: 12.8 % (ref 11.5–15.5)
WBC: 5.3 10*3/uL (ref 4.0–10.5)

## 2021-03-09 LAB — COMPREHENSIVE METABOLIC PANEL
ALT: 40 U/L (ref 0–53)
AST: 33 U/L (ref 0–37)
Albumin: 4.2 g/dL (ref 3.5–5.2)
Alkaline Phosphatase: 56 U/L (ref 39–117)
BUN: 14 mg/dL (ref 6–23)
CO2: 29 mEq/L (ref 19–32)
Calcium: 10.2 mg/dL (ref 8.4–10.5)
Chloride: 101 mEq/L (ref 96–112)
Creatinine, Ser: 1.06 mg/dL (ref 0.40–1.50)
GFR: 71.33 mL/min (ref >60.00)
Glucose, Bld: 116 mg/dL — ABNORMAL HIGH (ref 70–99)
Potassium: 4.3 mEq/L (ref 3.5–5.1)
Sodium: 138 mEq/L (ref 135–145)
Total Bilirubin: 0.7 mg/dL (ref 0.2–1.2)
Total Protein: 6.9 g/dL (ref 6.0–8.3)

## 2021-03-09 LAB — LIPID PANEL
Cholesterol: 182 mg/dL (ref 0–200)
HDL: 80.5 mg/dL (ref >39.00)
LDL Cholesterol: 91 mg/dL (ref 0–99)
NonHDL: 101.62
Total CHOL/HDL Ratio: 2
Triglycerides: 52 mg/dL (ref 0.0–149.0)
VLDL: 10.4 mg/dL (ref 0.0–40.0)

## 2021-03-09 LAB — PSA: PSA: 1.09 ng/mL (ref 0.10–4.00)

## 2021-03-09 LAB — TSH: TSH: 1.47 u[IU]/mL (ref 0.35–4.50)

## 2021-03-09 NOTE — Patient Instructions (Signed)
It was great seeing you today   Congratulations on the weight loss!   We will follow up with you regarding your blood work   Please follow up in one year or sooner if needed

## 2021-03-09 NOTE — Addendum Note (Signed)
Addended by: Elmer Picker on: 03/09/2021 07:31 AM   Modules accepted: Orders

## 2021-03-09 NOTE — Progress Notes (Signed)
Subjective:    Patient ID: Brett King, male    DOB: 1951-04-18, 70 y.o.   MRN: [id number]  HPI Patient presents for yearly preventative medicine examination. He is a pleasant 70 year old male who  has a past medical history of Hyperlipidemia and Hypertension.  Hypertension-is controlled with lisinopril/hydrochlorothiazide 20-12.5 mg.  He denies dizziness, lightheadedness, headaches, blurred vision, chest pain, or shortness of breath  BP Readings from Last 3 Encounters:  03/09/21 130/86  03/08/20 128/80  08/18/19 132/80   Hyperlipidemia-takes lipitor 80 mg  daily and aspirin 32 mg.  He denies myalgia or fatigue  Lab Results  Component Value Date   CHOL 204 (H) 03/08/2020   HDL 91.80 03/08/2020   LDLCALC 101 (H) 03/08/2020   LDLDIRECT 124.1 10/27/2013   TRIG 60.0 03/08/2020   CHOLHDL 2 03/08/2020   ED-Viagra as needed  BPH - asymptomatic   All immunizations and health maintenance protocols were reviewed with the patient and needed orders were placed.  Appropriate screening laboratory values were ordered for the patient including screening of hyperlipidemia, renal function and hepatic function. If indicated by BPH, a PSA was ordered.  Medication reconciliation,  past medical history, social history, problem list and allergies were reviewed in detail with the patient  Goals were established with regard to weight loss, exercise, and  diet in compliance with medications. He has stopped eating fast food and is eating more lean meats and salads. He has also started exercising. Reports " this is best I have ever felt"  Wt Readings from Last 3 Encounters:  03/09/21 155 lb (70.3 kg)  03/08/20 178 lb (80.7 kg)  08/18/19 182 lb (82.6 kg)   He is due for 10 year  colon cancer screening   Denies any acute issues   Review of Systems  Constitutional: Negative.   HENT: Negative.   Eyes: Negative.   Respiratory: Negative.   Cardiovascular: Negative.   Gastrointestinal: Negative.    Endocrine: Negative.   Genitourinary: Negative.   Musculoskeletal: Negative.   Skin: Negative.   Allergic/Immunologic: Negative.   Neurological: Negative.   Hematological: Negative.   Psychiatric/Behavioral: Negative.   All other systems reviewed and are negative.  Past Medical History:  Diagnosis Date  . Hyperlipidemia   . Hypertension     Social History   Socioeconomic History  . Marital status: Single    Spouse name: Not on file  . Number of children: Not on file  . Years of education: Not on file  . Highest education level: Not on file  Occupational History  . Not on file  Tobacco Use  . Smoking status: Former Research scientist (life sciences)  . Smokeless tobacco: Never Used  Substance and Sexual Activity  . Alcohol use: Yes    Comment: ocassionally  . Drug use: No  . Sexual activity: Not on file  Other Topics Concern  . 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 Difficulty of Paying Living Expenses: Not hard at all  Food Insecurity: No Food Insecurity  . Worried About Charity fundraiser in the Last Year: Never true  . Ran Out of Food in the Last Year: Never true  Transportation Needs: No Transportation Needs  . Lack of Transportation (Medical): No  . Lack of Transportation (Non-Medical): No  Physical Activity: Insufficiently Active  . Days of Exercise per Week: 4 days  . Minutes of Exercise per Session: 30 min  Stress: No Stress Concern Present  . Feeling of Stress : Not at all  Social Connections: Socially Isolated  . Frequency of Communication with Friends and Family: Once a week  . Frequency of Social Gatherings with Friends and Family: Once a week  . Attends Religious Services: Never  . Active Member of Clubs or Organizations: No  . Attends Archivist Meetings: Never  . Marital Status: Widowed   Intimate Partner Violence: Not At Risk  . Fear of Current or Ex-Partner: No  . Emotionally Abused: No  . Physically Abused: No  . Sexually Abused: No    Past Surgical History:  Procedure Laterality Date  . CHOLECYSTECTOMY      Family History  Problem Relation Age of Onset  . Pneumonia Mother   . Liver cancer Mother   . Pancreatic cancer Mother     No Known Allergies  Current Outpatient Medications on File Prior to Visit  Medication Sig Dispense Refill  . atorvastatin (LIPITOR) 80 MG tablet Take 1 tablet (80 mg total) by mouth daily. 90 tablet 0  . lisinopril-hydrochlorothiazide (ZESTORETIC) 20-12.5 MG tablet Take 1 tablet by mouth daily. 90 tablet 0  . sildenafil (REVATIO) 20 MG tablet TAKE 2-5 TABLETS BY MOUTH 30 MINUTES PRIOR TO INTERCOURSE 50 tablet 11  . VITAMIN D, CHOLECALCIFEROL, PO Take by mouth.     No current facility-administered medications on file prior to visit.    BP 130/86 (BP Location: Left Arm, Patient Position: Sitting, Cuff Size: Normal)   Pulse 92   Temp 98.4 F (36.9 C) (Oral)   Ht 5\' 11"  (1.803 m)   Wt 155 lb (70.3 kg)   BMI 21.62 kg/m       Objective:   Physical Exam Vitals and nursing note reviewed.  Constitutional:      General: He is not in acute distress.    Appearance: Normal appearance. He is well-developed and normal weight.  HENT:     Head: Normocephalic and atraumatic.     Right Ear: Tympanic membrane, ear canal and external ear normal. There is no impacted cerumen.     Left Ear: Tympanic membrane, ear canal and external ear normal. There is no impacted cerumen.     Nose: Nose normal. No congestion or rhinorrhea.     Mouth/Throat:     Mouth: Mucous membranes are moist.     Pharynx: Oropharynx is clear. No oropharyngeal exudate or posterior oropharyngeal erythema.  Eyes:     General:        Right eye: No discharge.        Left eye: No discharge.     Extraocular Movements: Extraocular movements intact.      Conjunctiva/sclera: Conjunctivae normal.     Pupils: Pupils are equal, round, and reactive to light.  Neck:     Vascular: No carotid bruit.     Trachea: No tracheal deviation.  Cardiovascular:     Rate and Rhythm: Normal rate and regular rhythm.     Pulses: Normal pulses.     Heart sounds: Normal heart sounds. No murmur heard. No friction rub. No gallop.   Pulmonary:     Effort: Pulmonary effort is normal. No respiratory distress.     Breath sounds: Normal breath sounds. No stridor. No wheezing, rhonchi or rales.  Chest:     Chest wall: No tenderness.  Abdominal:  General: Bowel sounds are normal. There is no distension.     Palpations: Abdomen is soft. There is no mass.     Tenderness: There is no abdominal tenderness. There is no right CVA tenderness, left CVA tenderness, guarding or rebound.     Hernia: No hernia is present.  Musculoskeletal:        General: No swelling, tenderness, deformity or signs of injury. Normal range of motion.     Right lower leg: No edema.     Left lower leg: No edema.  Lymphadenopathy:     Cervical: No cervical adenopathy.  Skin:    General: Skin is warm and dry.     Capillary Refill: Capillary refill takes less than 2 seconds.     Coloration: Skin is not jaundiced or pale.     Findings: No bruising, erythema, lesion or rash.  Neurological:     General: No focal deficit present.     Mental Status: He is alert and oriented to person, place, and time.     Cranial Nerves: No cranial nerve deficit.     Sensory: No sensory deficit.     Motor: No weakness.     Coordination: Coordination normal.     Gait: Gait normal.     Deep Tendon Reflexes: Reflexes normal.  Psychiatric:        Mood and Affect: Mood normal.        Behavior: Behavior normal.        Thought Content: Thought content normal.        Judgment: Judgment normal.       Assessment & Plan:  1. Pure hypercholesterolemia - Congratulated on weight loss  - Can stop ASA per new  guidelines  - Follow up in one year or sooner if needed  - CBC with Differential/Platelet; Future - Comprehensive metabolic panel; Future - Lipid panel; Future - TSH; Future  2. Essential hypertension - Controlled. No change in medications  - CBC with Differential/Platelet; Future - Comprehensive metabolic panel; Future - Lipid panel; Future - TSH; Future  3. BPH associated with nocturia  - PSA; Future  4. ERECTILE DYSFUNCTION, MILD - Continue with Viagra PRN   5. Colon cancer screening  - Ambulatory referral to Gastroenterology  [name]

## 2021-03-14 ENCOUNTER — Encounter: Payer: Self-pay | Admitting: *Deleted

## 2021-07-05 ENCOUNTER — Other Ambulatory Visit: Payer: Self-pay | Admitting: Adult Health

## 2021-07-05 DIAGNOSIS — I10 Essential (primary) hypertension: Secondary | ICD-10-CM

## 2021-09-12 DIAGNOSIS — Z23 Encounter for immunization: Secondary | ICD-10-CM | POA: Diagnosis not present

## 2021-09-21 ENCOUNTER — Ambulatory Visit (INDEPENDENT_AMBULATORY_CARE_PROVIDER_SITE_OTHER): Payer: Medicare Other

## 2021-09-21 DIAGNOSIS — Z1211 Encounter for screening for malignant neoplasm of colon: Secondary | ICD-10-CM | POA: Diagnosis not present

## 2021-09-21 DIAGNOSIS — Z0000 Encounter for general adult medical examination without abnormal findings: Secondary | ICD-10-CM

## 2021-09-21 NOTE — Patient Instructions (Signed)
Brett King , Thank you for taking time to come for your Medicare Wellness Visit. I appreciate your ongoing commitment to your health goals. Please review the following plan we discussed and let me know if I can assist you in the future.   Screening recommendations/referrals: Colonoscopy: referral 09/21/2021 Recommended yearly ophthalmology/optometry visit for glaucoma screening and checkup Recommended yearly dental visit for hygiene and checkup  Vaccinations: Influenza vaccine: completed  Pneumococcal vaccine: completed series  Tdap vaccine: 02/29/2019 Shingles vaccine: will consider     Advanced directives: none   Conditions/risks identified: none   Next appointment: 09/24/2021 0900  [facility]    Preventive Care 70 Years and Older, Male Preventive care refers to lifestyle choices and visits with your health care provider that can promote health and wellness. What does preventive care include? A yearly physical exam. This is also called an annual well check. Dental exams once or twice a year. Routine eye exams. Ask your health care provider how often you should have your eyes checked. Personal lifestyle choices, including: Daily care of your teeth and gums. Regular physical activity. Eating a healthy diet. Avoiding tobacco and drug use. Limiting alcohol use. Practicing safe sex. Taking low doses of aspirin every day. Taking vitamin and mineral supplements as recommended by your health care provider. What happens during an annual well check? The services and screenings done by your health care provider during your annual well check will depend on your age, overall health, lifestyle risk factors, and family history of disease. Counseling  Your health care provider may ask you questions about your: Alcohol use. Tobacco use. Drug use. Emotional well-being. Home and relationship well-being. Sexual activity. Eating habits. History of falls. Memory and ability to understand  (cognition). Work and work Statistician. Screening  You may have the following tests or measurements: Height, weight, and BMI. Blood pressure. Lipid and cholesterol levels. These may be checked every 5 years, or more frequently if you are over 70 years old. Skin check. Lung cancer screening. You may have this screening every year starting at age 20 if you have a 30-pack-year history of smoking and currently smoke or have quit within the past 15 years. Fecal occult blood test (FOBT) of the stool. You may have this test every year starting at age 48. Flexible sigmoidoscopy or colonoscopy. You may have a sigmoidoscopy every 5 years or a colonoscopy every 10 years starting at age 63. Prostate cancer screening. Recommendations will vary depending on your family history and other risks. Hepatitis C blood test. Hepatitis B blood test. Sexually transmitted disease (STD) testing. Diabetes screening. This is done by checking your blood sugar (glucose) after you have not eaten for a while (fasting). You may have this done every 1-3 years. Abdominal aortic aneurysm (AAA) screening. You may need this if you are a current or former smoker. Osteoporosis. You may be screened starting at age 58 if you are at high risk. Talk with your health care provider about your test results, treatment options, and if necessary, the need for more tests. Vaccines  Your health care provider may recommend certain vaccines, such as: Influenza vaccine. This is recommended every year. Tetanus, diphtheria, and acellular pertussis (Tdap, Td) vaccine. You may need a Td booster every 10 years. Zoster vaccine. You may need this after age 89. Pneumococcal 13-valent conjugate (PCV13) vaccine. One dose is recommended after age 73. Pneumococcal polysaccharide (PPSV23) vaccine. One dose is recommended after age 61. Talk to your health care provider about which screenings and vaccines  you need and how often you need them. This  information is not intended to replace advice given to you by your health care provider. Make sure you discuss any questions you have with your health care provider. Document Released: 01/13/2016 Document Revised: 09/05/2016 Document Reviewed: 10/18/2015 Elsevier Interactive Patient Education  2017 [place] Prevention in the Home Falls can cause injuries. They can happen to people of all ages. There are many things you can do to make your home safe and to help prevent falls. What can I do on the outside of my home? Regularly fix the edges of walkways and driveways and fix any cracks. Remove anything that might make you trip as you walk through a door, such as a raised step or threshold. Trim any bushes or trees on the path to your home. Use bright outdoor lighting. Clear any walking paths of anything that might make someone trip, such as rocks or tools. Regularly check to see if handrails are loose or broken. Make sure that both sides of any steps have handrails. Any raised decks and porches should have guardrails on the edges. Have any leaves, snow, or ice cleared regularly. Use sand or salt on walking paths during winter. Clean up any spills in your garage right away. This includes oil or grease spills. What can I do in the bathroom? Use night lights. Install grab bars by the toilet and in the tub and shower. Do not use towel bars as grab bars. Use non-skid mats or decals in the tub or shower. If you need to sit down in the shower, use a plastic, non-slip stool. Keep the floor dry. Clean up any water that spills on the floor as soon as it happens. Remove soap buildup in the tub or shower regularly. Attach bath mats securely with double-sided non-slip rug tape. Do not have throw rugs and other things on the floor that can make you trip. What can I do in the bedroom? Use night lights. Make sure that you have a light by your bed that is easy to reach. Do not use any sheets or  blankets that are too big for your bed. They should not hang down onto the floor. Have a firm chair that has side arms. You can use this for support while you get dressed. Do not have throw rugs and other things on the floor that can make you trip. What can I do in the kitchen? Clean up any spills right away. Avoid walking on wet floors. Keep items that you use a lot in easy-to-reach places. If you need to reach something above you, use a strong step stool that has a grab bar. Keep electrical cords out of the way. Do not use floor polish or wax that makes floors slippery. If you must use wax, use non-skid floor wax. Do not have throw rugs and other things on the floor that can make you trip. What can I do with my stairs? Do not leave any items on the stairs. Make sure that there are handrails on both sides of the stairs and use them. Fix handrails that are broken or loose. Make sure that handrails are as long as the stairways. Check any carpeting to make sure that it is firmly attached to the stairs. Fix any carpet that is loose or worn. Avoid having throw rugs at the top or bottom of the stairs. If you do have throw rugs, attach them to the floor with carpet tape. Make sure  that you have a light switch at the top of the stairs and the bottom of the stairs. If you do not have them, ask someone to add them for you. What else can I do to help prevent falls? Wear shoes that: Do not have high heels. Have rubber bottoms. Are comfortable and fit you well. Are closed at the toe. Do not wear sandals. If you use a stepladder: Make sure that it is fully opened. Do not climb a closed stepladder. Make sure that both sides of the stepladder are locked into place. Ask someone to hold it for you, if possible. Clearly mark and make sure that you can see: Any grab bars or handrails. First and last steps. Where the edge of each step is. Use tools that help you move around (mobility aids) if they are  needed. These include: Canes. Walkers. Scooters. Crutches. Turn on the lights when you go into a dark area. Replace any light bulbs as soon as they burn out. Set up your furniture so you have a clear path. Avoid moving your furniture around. If any of your floors are uneven, fix them. If there are any pets around you, be aware of where they are. Review your medicines with your doctor. Some medicines can make you feel dizzy. This can increase your chance of falling. Ask your doctor what other things that you can do to help prevent falls. This information is not intended to replace advice given to you by your health care provider. Make sure you discuss any questions you have with your health care provider. Document Released: 10/13/2009 Document Revised: 05/24/2016 Document Reviewed: 01/21/2015 Elsevier Interactive Patient Education  2017 [employer].

## 2021-09-21 NOTE — Progress Notes (Signed)
Subjective:   Brett King is a 70 y.o. male who presents for an Subsequent Medicare Annual Wellness Visit.   I connected with Wynelle Fanny today by telephone and verified that I am speaking with the correct person using two identifiers. Location patient: home Location provider: work Persons participating in the virtual visit: patient, provider.   I discussed the limitations, risks, security and privacy concerns of performing an evaluation and management service by telephone and the availability of in person appointments. I also discussed with the patient that there may be a patient responsible charge related to this service. The patient expressed understanding and verbally consented to this telephonic visit.    Interactive audio and video telecommunications were attempted between this provider and patient, however failed, due to patient having technical difficulties OR patient did not have access to video capability.  We continued and completed visit with audio only.    Review of Systems    N/A       Objective:    There were no vitals filed for this visit. There is no height or weight on file to calculate BMI.  Advanced Directives 09/20/2020  Does Patient Have a Medical Advance Directive? Yes  Type of Advance Directive Living will  Does patient want to make changes to medical advance directive? No - Patient declined    Current Medications (verified) Outpatient Encounter Medications as of 09/21/2021  Medication Sig   atorvastatin (LIPITOR) 80 MG tablet TAKE 1 TABLET DAILY   lisinopril-hydrochlorothiazide (ZESTORETIC) 20-12.5 MG tablet TAKE 1 TABLET DAILY   sildenafil (REVATIO) 20 MG tablet TAKE 2-5 TABLETS BY MOUTH 30 MINUTES PRIOR TO INTERCOURSE   VITAMIN D, CHOLECALCIFEROL, PO Take by mouth.   No facility-administered encounter medications on file as of 09/21/2021.    Allergies (verified) Patient has no known allergies.   History: Past Medical History:  Diagnosis Date    Hyperlipidemia    Hypertension    Past Surgical History:  Procedure Laterality Date   CHOLECYSTECTOMY     Family History  Problem Relation Age of Onset   Pneumonia Mother    Liver cancer Mother    Pancreatic cancer Mother    Social History   Socioeconomic History   Marital status: Single    Spouse name: Not on file   Number of children: Not on file   Years of education: Not on file   Highest education level: Not on file  Occupational History   Not on file  Tobacco Use   Smoking status: Former   Smokeless tobacco: Never  Substance and Sexual Activity   Alcohol use: Yes    Comment: ocassionally   Drug use: No   Sexual activity: Not on file  Other Topics Concern   Not on file  Social History Narrative   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  Patient Care Team: [name], NP as PCP - General (  Family Medicine)  Indicate any recent Medical Services you may have received from other than [facility] providers in the past year (date may be approximate).     Assessment:   This is a routine wellness examination for Brett King.  Hearing/Vision screen No results found.  Dietary issues and exercise activities discussed:     Goals Addressed   None    Depression Screen PHQ 2/9 Scores 03/09/2021 09/20/2020 02/19/2017 02/14/2016  PHQ - 2 Score 0 0 0 0  PHQ- 9 Score - 0 - -    Fall Risk Fall Risk  03/09/2021 09/20/2020 11/25/2019 11/19/2018 02/19/2017  Falls in the past year? 0 0 0 0 No  Comment - - Emmi  Telephone Survey: data to providers prior to load [employer] Telephone Survey: data to providers prior to load -  Number falls in past yr: 0 0 - - -  Injury with Fall? 0 0 - - -  Risk for fall due to : - No Fall Risks - - -  Follow up - Falls evaluation completed;Falls prevention discussed - - -    FALL RISK PREVENTION PERTAINING TO THE HOME:  Any stairs in or around the home? No  If so, are there any without handrails? No  Home free of loose throw rugs in walkways, pet beds, electrical cords, etc? Yes  Adequate lighting in your home to reduce risk of falls? Yes   ASSISTIVE DEVICES UTILIZED TO PR EVENT FALLS:  Life alert? No  Use of a cane, walker or w/c? No  Grab bars in the bathroom? No  Shower chair or bench in shower? No  Elevated toilet seat or a handicapped toilet? No    Cognitive Function:    Normal cognitive status assessed by direct observation by this Nurse Health Advisor. No abnormalities found.      Immunizations Immunization History  Administered Date(s) Administered   Fluad Quad(high Dose 65+) 09/08/2019   Influenza Split 10/14/2013   Influenza Whole 10/01/2007, 09/30/2009, 10/17/2010, 11/01/2011   Influenza, High Dose Seasonal PF 02/19/2017, 09/30/2018   Influenza, Seasonal, Injecte, Preservative Fre 09/08/2019   Influenza-Unspecified 12/25/2014, 01/10/2016   Janssen (J&J) SARS-COV-2 Vaccination 04/03/2020   Pneumococcal Conjugate-13 02/14/2016   Pneumococcal Polysaccharide-23 01/01/2004, 11/20/2012, 03/08/2020   Td 12/31/1996, 11/24/2007   Tdap 02/24/2018    TDAP status: Up to date  Flu Vaccine status: Up to date  Pneumococcal vaccine status: Up to date  Covid-19 vaccine status: Completed vaccines  Qualifies for Shingles Vaccine? Yes   Zostavax completed No   Shingrix Completed?: No.    Education has been provided regarding the importance of this vaccine. Patient has been advised to call insurance company to determine out of pocket expense if they have  not yet received this vaccine. Advised may also receive vaccine at local pharmacy or Health Dept. Verbalized acceptance and understanding.  Screening Tests Health Maintenance  Topic Date Due   Zoster Vaccines- Shingrix (1 of 2) Never done   COVID-19 Vaccine (2 - Booster for Janssen series) 05/29/2020   COLONOSCOPY (Pts 45-34yrs Insurance coverage will need to be confirmed)  01/30/2021   INFLUENZA VACCINE  07/31/2021   TETANUS/TDAP  02/25/2028   Hepatitis C Screening  Completed   HPV VACCINES  Aged Out    Health Maintenance  Health Maintenance Due  Topic Date Due   Zoster Vaccines- Shingrix (1 of 2) Never done   COVID-19 Vaccine (2 - Booster for [employer] series) 05/29/2020   COLONOSCOPY (Pts 45-12yrs Insurance coverage will need to be  confirmed)  01/30/2021   INFLUENZA VACCINE  07/31/2021    Colorectal cancer screening: Referral to GI placed 09/21/2021. Pt aware the office will call re: appt.  Lung Cancer Screening: (Low Dose CT Chest recommended if Age 65-80 years, 30 pack-year currently smoking OR have quit w/in 15years.) does not qualify.   Lung Cancer Screening Referral: n/a  Additional Screening:  Hepatitis C Screening: does not qualify; Completed 02/25/2019  Vision Screening: Recommended annual ophthalmology exams for early detection of glaucoma and other disorders of the eye. Is the patient up to date with their annual eye exam?  Yes  Who is the provider or what is the name of the office in which the patient attends annual eye exams? [facility]  If pt is not established with a provider, would they like to be referred to a provider to establish care? No .   Dental Screening: Recommended annual dental exams for proper oral hygiene  Community Resource Referral / Chronic Care Management: CRR required this visit?  No   CCM required this visit?  No      Plan:     I have personally reviewed and noted the following in the patient's chart:   Medical and social  history Use of alcohol, tobacco or illicit drugs  Current medications and supplements including opioid prescriptions. Patient is not currently taking opioid prescriptions. Functional ability and status Nutritional status Physical activity Advanced directives List of other physicians Hospitalizations, surgeries, and ER visits in previous 12 months Vitals Screenings to include cognitive, depression, and falls Referrals and appointments  In addition, I have reviewed and discussed with patient certain preventive protocols, quality metrics, and best practice recommendations. A written personalized care plan for preventive services as well as general preventive health recommendations were provided to patient.     Randel Pigg, LPN   [id number]/[id number]   Nurse Notes: none

## 2021-09-27 ENCOUNTER — Other Ambulatory Visit: Payer: Self-pay | Admitting: Adult Health

## 2021-09-27 DIAGNOSIS — I10 Essential (primary) hypertension: Secondary | ICD-10-CM

## 2022-01-08 ENCOUNTER — Other Ambulatory Visit: Payer: Self-pay | Admitting: Adult Health

## 2022-01-08 DIAGNOSIS — I10 Essential (primary) hypertension: Secondary | ICD-10-CM

## 2022-01-30 ENCOUNTER — Encounter: Payer: Self-pay | Admitting: Adult Health

## 2022-03-13 ENCOUNTER — Ambulatory Visit (INDEPENDENT_AMBULATORY_CARE_PROVIDER_SITE_OTHER): Payer: Medicare Other | Admitting: Adult Health

## 2022-03-13 ENCOUNTER — Encounter: Payer: Self-pay | Admitting: Adult Health

## 2022-03-13 VITALS — BP 130/82 | HR 115 | Temp 97.6°F | Ht 71.0 in | Wt 173.0 lb

## 2022-03-13 DIAGNOSIS — R351 Nocturia: Secondary | ICD-10-CM

## 2022-03-13 DIAGNOSIS — N401 Enlarged prostate with lower urinary tract symptoms: Secondary | ICD-10-CM | POA: Diagnosis not present

## 2022-03-13 DIAGNOSIS — Z1211 Encounter for screening for malignant neoplasm of colon: Secondary | ICD-10-CM

## 2022-03-13 DIAGNOSIS — E7800 Pure hypercholesterolemia, unspecified: Secondary | ICD-10-CM

## 2022-03-13 DIAGNOSIS — I10 Essential (primary) hypertension: Secondary | ICD-10-CM

## 2022-03-13 LAB — COMPREHENSIVE METABOLIC PANEL
ALT: 51 U/L (ref 0–53)
AST: 43 U/L — ABNORMAL HIGH (ref 0–37)
Albumin: 4.9 g/dL (ref 3.5–5.2)
Alkaline Phosphatase: 71 U/L (ref 39–117)
BUN: 16 mg/dL (ref 6–23)
CO2: 27 mEq/L (ref 19–32)
Calcium: 11 mg/dL — ABNORMAL HIGH (ref 8.4–10.5)
Chloride: 98 mEq/L (ref 96–112)
Creatinine, Ser: 1.07 mg/dL (ref 0.40–1.50)
GFR: 70.03 mL/min (ref >60.00)
Glucose, Bld: 108 mg/dL — ABNORMAL HIGH (ref 70–99)
Potassium: 4.6 mEq/L (ref 3.5–5.1)
Sodium: 137 mEq/L (ref 135–145)
Total Bilirubin: 1.1 mg/dL (ref 0.2–1.2)
Total Protein: 7.4 g/dL (ref 6.0–8.3)

## 2022-03-13 LAB — CBC WITH DIFFERENTIAL/PLATELET
Basophils Absolute: 0.1 10*3/uL (ref 0.0–0.1)
Basophils Relative: 0.9 % (ref 0.0–3.0)
Eosinophils Absolute: 0.1 10*3/uL (ref 0.0–0.7)
Eosinophils Relative: 2 % (ref 0.0–5.0)
HCT: 46.9 % (ref 39.0–52.0)
Hemoglobin: 15.8 g/dL (ref 13.0–17.0)
Lymphocytes Relative: 12.8 % (ref 12.0–46.0)
Lymphs Abs: 0.9 10*3/uL (ref 0.7–4.0)
MCHC: 33.6 g/dL (ref 30.0–36.0)
MCV: 97.5 fl (ref 78.0–100.0)
Monocytes Absolute: 0.7 10*3/uL (ref 0.1–1.0)
Monocytes Relative: 9.7 % (ref 3.0–12.0)
Neutro Abs: 5.3 10*3/uL (ref 1.4–7.7)
Neutrophils Relative %: 74.6 % (ref 43.0–77.0)
Platelets: 207 10*3/uL (ref 150.0–400.0)
RBC: 4.8 Mil/uL (ref 4.22–5.81)
RDW: 13.3 % (ref 11.5–15.5)
WBC: 7.2 10*3/uL (ref 4.0–10.5)

## 2022-03-13 LAB — LIPID PANEL
Cholesterol: 195 mg/dL (ref 0–200)
HDL: 98.9 mg/dL (ref >39.00)
LDL Cholesterol: 84 mg/dL (ref 0–99)
NonHDL: 96.38
Total CHOL/HDL Ratio: 2
Triglycerides: 63 mg/dL (ref 0.0–149.0)
VLDL: 12.6 mg/dL (ref 0.0–40.0)

## 2022-03-13 LAB — PSA: PSA: 2.25 ng/mL (ref 0.10–4.00)

## 2022-03-13 LAB — TSH: TSH: 1.46 u[IU]/mL (ref 0.35–5.50)

## 2022-03-13 NOTE — Patient Instructions (Signed)
It was great seeing you today   We will follow up with you regarding your lab work   Please let me know if you need anything   

## 2022-03-13 NOTE — Progress Notes (Signed)
? ?Subjective:  ? ? Patient ID: Brett King, male    DOB: 03/14/1951, 71 y.o.   MRN: [id number] ? ?HPI ?Patient presents for yearly preventative medicine examination. He is a pleasant 71 year old male who  has a past medical history of Hyperlipidemia and Hypertension. ? ?Hypertension-is controlled with lisinopril/HCTZ 20-12.5 mg daily.  He denies dizziness, lightheadedness, blurred vision, chest pain, shortness of breath ? ?BP Readings from Last 3 Encounters:  ?03/13/22 140/82  ?03/09/21 130/86  ?03/08/20 128/80  ? ?Hyperlipidemia-takes Lipitor 80 mg daily.  He denies myalgia or fatigue ?Lab Results  ?Component Value Date  ? CHOL 182 03/09/2021  ? HDL 80.50 03/09/2021  ? [place] 91 03/09/2021  ? LDLDIRECT 124.1 10/27/2013  ? TRIG 52.0 03/09/2021  ? CHOLHDL 2 03/09/2021  ? ? ?ED-uses Viagra as needed ? ?BPH-asymptomatic.  Not on any medication ? ?All immunizations and health maintenance protocols were reviewed with the patient and needed orders were placed. ? ?Appropriate screening laboratory values were ordered for the patient including screening of hyperlipidemia, renal function and hepatic function. ?If indicated by BPH, a PSA was ordered. ? ?Medication reconciliation,  past medical history, social history, problem list and allergies were reviewed in detail with the patient ? ?Goals were established with regard to weight loss, exercise, and  diet in compliance with medications. He continues to try eat healthy, lean mats and vegetables. He is walking a few times a week.  ?Wt Readings from Last 3 Encounters:  ?03/13/22 173 lb (78.5 kg)  ?03/09/21 155 lb (70.3 kg)  ?03/08/20 178 lb (80.7 kg)  ? ?He is overdue for screening-order was placed last year but was never completed. He is going to try and get it done this year.  ? ?He has no acute complaints today.  ? ?Review of Systems  ?Constitutional: Negative.   ?HENT: Negative.    ?Eyes: Negative.   ?Respiratory: Negative.    ?Cardiovascular: Negative.    ?Gastrointestinal: Negative.   ?Endocrine: Negative.   ?Genitourinary: Negative.   ?Musculoskeletal: Negative.   ?Skin: Negative.   ?Allergic/Immunologic: Negative.   ?Neurological: Negative.   ?Hematological: Negative.   ?Psychiatric/Behavioral: Negative.    ?All other systems reviewed and are negative. ? ?Past Medical History:  ?Diagnosis Date  ? Hyperlipidemia   ? Hypertension   ? ? ?Social History  ? ?Socioeconomic History  ? Marital status: Single  ?  Spouse name: Not on file  ? Number of children: Not on file  ? Years of education: Not on file  ? Highest education level: Not on file  ?Occupational History  ? Not on file  ?Tobacco Use  ? Smoking status: Former  ? Smokeless tobacco: Never  ?Substance and Sexual Activity  ? Alcohol use: Yes  ?  Comment: ocassionally  ? Drug use: No  ? Sexual activity: Not on file  ?Other Topics Concern  ? Not on file  ?Social History Narrative  ? Retired since May 2014 - worked in a [employer]   ? Two children  ? Multiple Grandchildren   ? ?Social Determinants of Health  ? ?Financial Resource Strain: Low Risk   ? Difficulty of Paying Living Expenses: Not hard at all  ?Food Insecurity: No Food Insecurity  ? Worried About Charity fundraiser in the Last Year: Never true  ? Ran Out of Food in the Last Year: Never true  ?Transportation Needs: No Transportation Needs  ? Lack of Transportation (Medical): No  ? Lack of Transportation (Non-Medical):  No  ?Physical Activity: Sufficiently Active  ? Days of Exercise per Week: 3 days  ? Minutes of Exercise per Session: 50 min  ?Stress: No Stress Concern Present  ? Feeling of Stress : Not at all  ?Social Connections: Socially Isolated  ? Frequency of Communication with Friends and Family: Three times a week  ? Frequency of Social Gatherings with Friends and Family: Three times a week  ? Attends Religious Services: Never  ? Active Member of Clubs or Organizations: No  ? Attends Archivist Meetings: Never  ? Marital Status:  Widowed  ?Intimate Partner Violence: Not At Risk  ? Fear of Current or Ex-Partner: No  ? Emotionally Abused: No  ? Physically Abused: No  ? Sexually Abused: No  ? ? ?Past Surgical History:  ?Procedure Laterality Date  ? CHOLECYSTECTOMY    ? ? ?Family History  ?Problem Relation Age of Onset  ? Pneumonia Mother   ? Liver cancer Mother   ? Pancreatic cancer Mother   ? ? ?No Known Allergies ? ?Current Outpatient Medications on File Prior to Visit  ?Medication Sig Dispense Refill  ? atorvastatin (LIPITOR) 80 MG tablet TAKE 1 TABLET DAILY 90 tablet 0  ? lisinopril-hydrochlorothiazide (ZESTORETIC) 20-12.5 MG tablet TAKE 1 TABLET DAILY 90 tablet 0  ? sildenafil (REVATIO) 20 MG tablet TAKE 2-5 TABLETS BY MOUTH 30 MINUTES PRIOR TO INTERCOURSE 50 tablet 11  ? VITAMIN D, CHOLECALCIFEROL, PO Take by mouth.    ? ?No current facility-administered medications on file prior to visit.  ? ? ?BP 140/82   Pulse (!) 115   Temp 97.6 ?F (36.4 ?C) (Oral)   Ht '5\' 11"'$  (1.803 m)   Wt 173 lb (78.5 kg)   SpO2 99%   BMI 24.13 kg/m?  ? ? ?   ?Objective:  ? Physical Exam ?Vitals and nursing note reviewed.  ?Constitutional:   ?   General: He is not in acute distress. ?   Appearance: Normal appearance. He is well-developed and normal weight.  ?HENT:  ?   Head: Normocephalic and atraumatic.  ?   Right Ear: Tympanic membrane, ear canal and external ear normal. There is no impacted cerumen.  ?   Left Ear: Tympanic membrane, ear canal and external ear normal. There is no impacted cerumen.  ?   Nose: Nose normal. No congestion or rhinorrhea.  ?   Mouth/Throat:  ?   Mouth: Mucous membranes are moist.  ?   Pharynx: Oropharynx is clear. No oropharyngeal exudate or posterior oropharyngeal erythema.  ?Eyes:  ?   General:     ?   Right eye: No discharge.     ?   Left eye: No discharge.  ?   Extraocular Movements: Extraocular movements intact.  ?   Conjunctiva/sclera: Conjunctivae normal.  ?   Pupils: Pupils are equal, round, and reactive to light.  ?Neck:   ?   Vascular: No carotid bruit.  ?   Trachea: No tracheal deviation.  ?Cardiovascular:  ?   Rate and Rhythm: Normal rate and regular rhythm.  ?   Pulses: Normal pulses.  ?   Heart sounds: Normal heart sounds. No murmur heard. ?  No friction rub. No gallop.  ?Pulmonary:  ?   Effort: Pulmonary effort is normal. No respiratory distress.  ?   Breath sounds: Normal breath sounds. No stridor. No wheezing, rhonchi or rales.  ?Chest:  ?   Chest wall: No tenderness.  ?Abdominal:  ?   General: Bowel sounds  are normal. There is no distension.  ?   Palpations: Abdomen is soft. There is no mass.  ?   Tenderness: There is no abdominal tenderness. There is no right CVA tenderness, left CVA tenderness, guarding or rebound.  ?   Hernia: No hernia is present.  ?Musculoskeletal:     ?   General: No swelling, tenderness, deformity or signs of injury. Normal range of motion.  ?   Right lower leg: No edema.  ?   Left lower leg: No edema.  ?Lymphadenopathy:  ?   Cervical: No cervical adenopathy.  ?Skin: ?   General: Skin is warm and dry.  ?   Capillary Refill: Capillary refill takes less than 2 seconds.  ?   Coloration: Skin is not jaundiced or pale.  ?   Findings: No bruising, erythema, lesion or rash.  ?Neurological:  ?   General: No focal deficit present.  ?   Mental Status: He is alert and oriented to person, place, and time.  ?   Cranial Nerves: No cranial nerve deficit.  ?   Sensory: No sensory deficit.  ?   Motor: No weakness.  ?   Coordination: Coordination normal.  ?   Gait: Gait normal.  ?   Deep Tendon Reflexes: Reflexes normal.  ?Psychiatric:     ?   Mood and Affect: Mood normal.     ?   Behavior: Behavior normal.     ?   Thought Content: Thought content normal.     ?   Judgment: Judgment normal.  ? ?   ?Assessment & Plan:  ?1. Pure hypercholesterolemia ?- Continue with statin  ?- encouraged heart healthy diet and exercise  ?- CBC with Differential/Platelet; Future ?- Comprehensive metabolic panel; Future ?- Lipid panel;  Future ?- TSH; Future ? ?2. Essential hypertension ?- Well controlled. No change in medications  ?- CBC with Differential/Platelet; Future ?- Comprehensive metabolic panel; Future ?- Lipid panel; Future ?- TSH; Future ? ?3. BPH as

## 2022-04-16 ENCOUNTER — Telehealth: Payer: Self-pay | Admitting: Adult Health

## 2022-04-16 NOTE — Telephone Encounter (Signed)
Pt is calling and needs a refill on atorvastatin (LIPITOR) 80 MG tablet and lisinopril-hydrochlorothiazide (ZESTORETIC) 20-12.5 MG tablet . Pt last seen 03-22-2022  ?[facility] [place] [place] [facility] [place] Sites Phone:  [phone]  ?Fax:  [phone]  ?  ? ?

## 2022-04-18 ENCOUNTER — Other Ambulatory Visit: Payer: Self-pay

## 2022-04-18 DIAGNOSIS — I10 Essential (primary) hypertension: Secondary | ICD-10-CM

## 2022-04-18 MED ORDER — ATORVASTATIN CALCIUM 80 MG PO TABS: 80.0000 mg | ORAL_TABLET | Freq: Every day | ORAL | 0 refills | Status: DC

## 2022-04-18 MED ORDER — LISINOPRIL-HYDROCHLOROTHIAZIDE 20-12.5 MG PO TABS: 1.0000 | ORAL_TABLET | Freq: Every day | ORAL | 0 refills | Status: DC

## 2022-04-18 NOTE — Telephone Encounter (Signed)
[facility]  ?

## 2022-07-12 ENCOUNTER — Other Ambulatory Visit: Payer: Self-pay | Admitting: Adult Health

## 2022-07-12 DIAGNOSIS — I10 Essential (primary) hypertension: Secondary | ICD-10-CM

## 2022-09-05 DIAGNOSIS — Z23 Encounter for immunization: Secondary | ICD-10-CM | POA: Diagnosis not present

## 2022-09-24 ENCOUNTER — Ambulatory Visit: Payer: Medicare Other

## 2022-09-28 DIAGNOSIS — Z23 Encounter for immunization: Secondary | ICD-10-CM | POA: Diagnosis not present

## 2022-11-05 ENCOUNTER — Telehealth: Payer: Self-pay | Admitting: Adult Health

## 2022-11-05 NOTE — Telephone Encounter (Signed)
Pt is calling and would like a refill atorvastatin (LIPITOR) 80 MG tablet  and isinopril-hydrochlorothiazide (ZESTORETIC) 20-12.5 MG tablet #90 each w/refills  [facility] [place] [place] to Registered [place] Sites Phone: [phone]  Fax: [phone]

## 2022-11-06 ENCOUNTER — Other Ambulatory Visit: Payer: Self-pay

## 2022-11-06 DIAGNOSIS — I10 Essential (primary) hypertension: Secondary | ICD-10-CM

## 2022-11-06 MED ORDER — LISINOPRIL-HYDROCHLOROTHIAZIDE 20-12.5 MG PO TABS: 1.0000 | ORAL_TABLET | Freq: Every day | ORAL | 0 refills | Status: DC

## 2022-11-06 MED ORDER — ATORVASTATIN CALCIUM 80 MG PO TABS: 80.0000 mg | ORAL_TABLET | Freq: Every day | ORAL | 0 refills | Status: DC

## 2022-11-06 NOTE — Telephone Encounter (Signed)
Rx has been refilled.  

## 2023-01-24 ENCOUNTER — Telehealth: Payer: Self-pay | Admitting: Adult Health

## 2023-01-24 ENCOUNTER — Other Ambulatory Visit: Payer: Self-pay

## 2023-01-24 DIAGNOSIS — I10 Essential (primary) hypertension: Secondary | ICD-10-CM

## 2023-01-24 MED ORDER — LISINOPRIL-HYDROCHLOROTHIAZIDE 20-12.5 MG PO TABS: 1.0000 | ORAL_TABLET | Freq: Every day | ORAL | 0 refills | Status: DC

## 2023-01-24 MED ORDER — ATORVASTATIN CALCIUM 80 MG PO TABS: 80.0000 mg | ORAL_TABLET | Freq: Every day | ORAL | 0 refills | Status: DC

## 2023-01-24 NOTE — Telephone Encounter (Signed)
  atorvastatin (LIPITOR) 80 MG tablet  lisinopril-hydrochlorothiazide (ZESTORETIC) 20-12.5 MG tablet  [facility] [place] [place] [facility] [place] Sites Phone: [phone]  Fax: [phone]

## 2023-01-24 NOTE — Telephone Encounter (Signed)
Rx refilled.

## 2023-03-15 ENCOUNTER — Ambulatory Visit (INDEPENDENT_AMBULATORY_CARE_PROVIDER_SITE_OTHER): Payer: Medicare Other | Admitting: Adult Health

## 2023-03-15 ENCOUNTER — Encounter: Payer: Self-pay | Admitting: Adult Health

## 2023-03-15 VITALS — BP 130/78 | HR 111 | Temp 98.0°F | Ht 71.0 in | Wt 159.2 lb

## 2023-03-15 DIAGNOSIS — R351 Nocturia: Secondary | ICD-10-CM | POA: Diagnosis not present

## 2023-03-15 DIAGNOSIS — R7301 Impaired fasting glucose: Secondary | ICD-10-CM

## 2023-03-15 DIAGNOSIS — I10 Essential (primary) hypertension: Secondary | ICD-10-CM

## 2023-03-15 DIAGNOSIS — E7800 Pure hypercholesterolemia, unspecified: Secondary | ICD-10-CM

## 2023-03-15 DIAGNOSIS — Z1211 Encounter for screening for malignant neoplasm of colon: Secondary | ICD-10-CM

## 2023-03-15 DIAGNOSIS — N401 Enlarged prostate with lower urinary tract symptoms: Secondary | ICD-10-CM

## 2023-03-15 LAB — CBC
HCT: 45.5 % (ref 39.0–52.0)
Hemoglobin: 15.5 g/dL (ref 13.0–17.0)
MCHC: 34 g/dL (ref 30.0–36.0)
MCV: 97.9 fl (ref 78.0–100.0)
Platelets: 252 10*3/uL (ref 150.0–400.0)
RBC: 4.65 Mil/uL (ref 4.22–5.81)
RDW: 13 % (ref 11.5–15.5)
WBC: 8.6 10*3/uL (ref 4.0–10.5)

## 2023-03-15 LAB — LIPID PANEL
Cholesterol: 200 mg/dL (ref 0–200)
HDL: 113.3 mg/dL (ref >39.00)
LDL Cholesterol: 78 mg/dL (ref 0–99)
NonHDL: 86.63
Total CHOL/HDL Ratio: 2
Triglycerides: 43 mg/dL (ref 0.0–149.0)
VLDL: 8.6 mg/dL (ref 0.0–40.0)

## 2023-03-15 LAB — COMPREHENSIVE METABOLIC PANEL
ALT: 89 U/L — ABNORMAL HIGH (ref 0–53)
AST: 73 U/L — ABNORMAL HIGH (ref 0–37)
Albumin: 4.5 g/dL (ref 3.5–5.2)
Alkaline Phosphatase: 67 U/L (ref 39–117)
BUN: 16 mg/dL (ref 6–23)
CO2: 26 mEq/L (ref 19–32)
Calcium: 11 mg/dL — ABNORMAL HIGH (ref 8.4–10.5)
Chloride: 100 mEq/L (ref 96–112)
Creatinine, Ser: 1.03 mg/dL (ref 0.40–1.50)
GFR: 72.79 mL/min (ref >60.00)
Glucose, Bld: 98 mg/dL (ref 70–99)
Potassium: 4.8 mEq/L (ref 3.5–5.1)
Sodium: 139 mEq/L (ref 135–145)
Total Bilirubin: 0.9 mg/dL (ref 0.2–1.2)
Total Protein: 7.3 g/dL (ref 6.0–8.3)

## 2023-03-15 LAB — TSH: TSH: 0.83 u[IU]/mL (ref 0.35–5.50)

## 2023-03-15 LAB — PSA: PSA: 1.51 ng/mL (ref 0.10–4.00)

## 2023-03-15 LAB — HEMOGLOBIN A1C: Hgb A1c MFr Bld: 5.5 % (ref 4.6–6.5)

## 2023-03-15 MED ORDER — ATORVASTATIN CALCIUM 80 MG PO TABS: 80.0000 mg | ORAL_TABLET | Freq: Every day | ORAL | 3 refills | Status: DC

## 2023-03-15 MED ORDER — LISINOPRIL-HYDROCHLOROTHIAZIDE 20-12.5 MG PO TABS: 1.0000 | ORAL_TABLET | Freq: Every day | ORAL | 3 refills | Status: DC

## 2023-03-15 NOTE — Patient Instructions (Signed)
It was great seeing you today   We will follow up with you regarding your lab work   Please let me know if you need anything   Please get your shingles vaccinations at the pharmacies

## 2023-03-15 NOTE — Progress Notes (Signed)
Subjective:    Patient ID: Brett King, male    DOB: 19-Jan-1951, 72 y.o.   MRN: [id number]  HPI Patient presents for yearly preventative medicine examination. He is a pleasant 72 year old male who  has a past medical history of Hyperlipidemia and Hypertension.  Hypertension-is controlled with lisinopril/HCTZ 20-12.5 mg daily.  He denies dizziness, lightheadedness, blurred vision, chest pain, shortness of breath BP Readings from Last 3 Encounters:  03/15/23 130/78  03/13/22 130/82  03/09/21 130/86   Hyperlipidemia-takes Lipitor 80 mg daily.  He denies myalgia or fatigue Lab Results  Component Value Date   CHOL 195 03/13/2022   HDL 98.90 03/13/2022   LDLCALC 84 03/13/2022   LDLDIRECT 124.1 10/27/2013   TRIG 63.0 03/13/2022   CHOLHDL 2 03/13/2022   BPH-asymptomatic.  Not on any medication  Elevated fasting glucose - not currently on medication  Lab Results  Component Value Date   HGBA1C 5.4 11/08/2006   All immunizations and health maintenance protocols were reviewed with the patient and needed orders were placed.Advised shingles vaccinations at pharmacy.   Appropriate screening laboratory values were ordered for the patient including screening of hyperlipidemia, renal function and hepatic function. If indicated by BPH, a PSA was ordered.  Medication reconciliation,  past medical history, social history, problem list and allergies were reviewed in detail with the patient  Goals were established with regard to weight loss, exercise, and  diet in compliance with medications. He continues to eat a heart healthy diet. He has been walking and has been able to lose weight.  Wt Readings from Last 3 Encounters:  03/15/23 159 lb 4 oz (72.2 kg)  03/13/22 173 lb (78.5 kg)  03/09/21 155 lb (70.3 kg)   He is overdue for screening colonoscopy.   Review of Systems  Constitutional: Negative.   HENT: Negative.    Eyes: Negative.   Respiratory: Negative.    Cardiovascular: Negative.    Gastrointestinal: Negative.   Endocrine: Negative.   Genitourinary: Negative.   Musculoskeletal: Negative.   Skin: Negative.   Allergic/Immunologic: Negative.   Neurological: Negative.   Hematological: Negative.   Psychiatric/Behavioral: Negative.    All other systems reviewed and are negative.  Past Medical History:  Diagnosis Date   Hyperlipidemia    Hypertension     Social History   Socioeconomic History   Marital status: Single    Spouse name: Not on file   Number of children: Not on file   Years of education: Not on file   Highest education level: Not on file  Occupational History   Not on file  Tobacco Use   Smoking status: Former   Smokeless tobacco: Never  Substance and Sexual Activity   Alcohol use: Yes    Comment: ocassionally   Drug use: No   Sexual activity: Not on file  Other Topics Concern   Not on file  Social History Narrative   Retired since May 2014 - worked in a [employer] company    Two children   Multiple Grandchildren    Social Determinants of Health   Financial Resource Strain: [place]  (09/21/2021)   Overall Financial Resource Strain (CARDIA)    Difficulty of Paying Living Expenses: Not hard at all  Food Insecurity: No [place] (09/21/2021)   Hunger Vital Sign    Worried About Running Out of Food in the Last Year: Never true    [place] in the Last Year: Never true  Transportation Needs: No Transportation  Needs (09/21/2021)   PRAPARE - Hydrologist (Medical): No    Lack of Transportation (Non-Medical): No  Physical Activity: Sufficiently Active (09/21/2021)   Exercise Vital Sign    Days of Exercise per Week: 3 days    Minutes of Exercise per Session: 50 min  Stress: No Stress Concern Present (09/21/2021)   [place]    Feeling of Stress : Not at all  Social Connections: Socially Isolated (09/21/2021)   Social Connection and  Isolation Panel [NHANES]    Frequency of Communication with Friends and Family: Three times a week    Frequency of Social Gatherings with Friends and Family: Three times a week    Attends Religious Services: Never    Active Member of Clubs or Organizations: No    Attends Archivist Meetings: Never    Marital Status: Widowed  Intimate Partner Violence: Not At Risk (09/21/2021)   Humiliation, Afraid, Rape, and Kick questionnaire    Fear of Current or Ex-Partner: No    Emotionally Abused: No    Physically Abused: No    Sexually Abused: No    Past Surgical History:  Procedure Laterality Date   CHOLECYSTECTOMY      Family History  Problem Relation Age of Onset   Pneumonia Mother    Liver cancer Mother    Pancreatic cancer Mother     No Known Allergies  Current Outpatient Medications on File Prior to Visit  Medication Sig Dispense Refill   atorvastatin (LIPITOR) 80 MG tablet Take 1 tablet (80 mg total) by mouth daily. 90 tablet 0   lisinopril-hydrochlorothiazide (ZESTORETIC) 20-12.5 MG tablet Take 1 tablet by mouth daily. 90 tablet 0   sildenafil (REVATIO) 20 MG tablet TAKE 2-5 TABLETS BY MOUTH 30 MINUTES PRIOR TO INTERCOURSE 50 tablet 11   VITAMIN D, CHOLECALCIFEROL, PO Take by mouth.     No current facility-administered medications on file prior to visit.    BP 130/78 (BP Location: Left Arm, Patient Position: Sitting, Cuff Size: Large)   Pulse (!) 111   Temp 98 F (36.7 C) (Oral)   Ht 5\' 11"  (1.803 m)   Wt 159 lb 4 oz (72.2 kg)   SpO2 98%   BMI 22.21 kg/m       Objective:   Physical Exam Vitals and nursing note reviewed.  Constitutional:      General: He is not in acute distress.    Appearance: Normal appearance. He is not ill-appearing.  HENT:     Head: Normocephalic and atraumatic.     Right Ear: Tympanic membrane, ear canal and external ear normal. There is no impacted cerumen.     Left Ear: Tympanic membrane, ear canal and external ear normal.  There is no impacted cerumen.     Nose: Nose normal. No congestion or rhinorrhea.     Mouth/Throat:     Mouth: Mucous membranes are moist.     Pharynx: Oropharynx is clear.  Eyes:     Extraocular Movements: Extraocular movements intact.     Conjunctiva/sclera: Conjunctivae normal.     Pupils: Pupils are equal, round, and reactive to light.  Neck:     Vascular: No carotid bruit.  Cardiovascular:     Rate and Rhythm: Normal rate and regular rhythm.     Pulses: Normal pulses.     Heart sounds: No murmur heard.    No friction rub. No gallop.  Pulmonary:  Effort: Pulmonary effort is normal.     Breath sounds: Normal breath sounds.  Abdominal:     General: Abdomen is flat. Bowel sounds are normal. There is no distension.     Palpations: Abdomen is soft. There is no mass.     Tenderness: There is no abdominal tenderness. There is no guarding or rebound.     Hernia: No hernia is present.  Musculoskeletal:        General: Normal range of motion.     Cervical back: Normal range of motion and neck supple.  Lymphadenopathy:     Cervical: No cervical adenopathy.  Skin:    General: Skin is warm and dry.     Capillary Refill: Capillary refill takes less than 2 seconds.  Neurological:     General: No focal deficit present.     Mental Status: He is alert and oriented to person, place, and time.  Psychiatric:        Mood and Affect: Mood normal.        Behavior: Behavior normal.        Thought Content: Thought content normal.        Judgment: Judgment normal.       Assessment & Plan:  1. Essential hypertension - well controlled. No change in medication  - Lipid panel; Future - TSH; Future - CBC; Future - Comprehensive metabolic panel; Future - lisinopril-hydrochlorothiazide (ZESTORETIC) 20-12.5 MG tablet; Take 1 tablet by mouth daily.  Dispense: 90 tablet; Refill: 3  2. Pure hypercholesterolemia - Continue with Lipitor 80 mg daily.  - Lipid panel; Future - TSH; Future - CBC;  Future - Comprehensive metabolic panel; Future  3. BPH associated with nocturia  - PSA; Future  4. Colon cancer screening  - Ambulatory referral to Gastroenterology  5. Elevated fasting glucose  - Hemoglobin A1c; Future

## 2023-03-19 ENCOUNTER — Other Ambulatory Visit: Payer: Self-pay | Admitting: Adult Health

## 2023-03-19 DIAGNOSIS — R748 Abnormal levels of other serum enzymes: Secondary | ICD-10-CM

## 2023-05-03 ENCOUNTER — Telehealth: Payer: Self-pay | Admitting: Adult Health

## 2023-05-03 NOTE — Telephone Encounter (Signed)
Prescription Request  05/03/2023  LOV: 03/15/2023  What is the name of the medication or equipment? atorvastatin (LIPITOR) 80 MG tablet  lisinopril-hydrochlorothiazide (ZESTORETIC) 20-12.5 MG tablet  Have you contacted your pharmacy to request a refill? No   Which pharmacy would you like this sent to?   [facility] [place], [place] - [address] [facility] to [facility] [address] [place] [address] Phone: [phone] Fax: [phone]    Patient notified that their request is being sent to the clinical staff for review and that they should receive a response within 2 business days.   Please advise at Mobile [phone] (mobile)

## 2023-05-03 NOTE — Telephone Encounter (Signed)
Pt notified that Rx was sent in already. Pt verbalized understanding.

## 2023-09-25 DIAGNOSIS — Z23 Encounter for immunization: Secondary | ICD-10-CM | POA: Diagnosis not present

## 2024-03-18 ENCOUNTER — Ambulatory Visit: Payer: Medicare Other | Admitting: Adult Health

## 2024-03-18 VITALS — BP 123/68 | HR 108 | Temp 97.7°F | Ht 70.25 in | Wt 164.0 lb

## 2024-03-18 DIAGNOSIS — E7800 Pure hypercholesterolemia, unspecified: Secondary | ICD-10-CM

## 2024-03-18 DIAGNOSIS — N401 Enlarged prostate with lower urinary tract symptoms: Secondary | ICD-10-CM

## 2024-03-18 DIAGNOSIS — R748 Abnormal levels of other serum enzymes: Secondary | ICD-10-CM

## 2024-03-18 DIAGNOSIS — I10 Essential (primary) hypertension: Secondary | ICD-10-CM | POA: Diagnosis not present

## 2024-03-18 DIAGNOSIS — Z5320 Procedure and treatment not carried out because of patient's decision for unspecified reasons: Secondary | ICD-10-CM | POA: Diagnosis not present

## 2024-03-18 DIAGNOSIS — R351 Nocturia: Secondary | ICD-10-CM | POA: Diagnosis not present

## 2024-03-18 LAB — HEPATIC FUNCTION PANEL
ALT: 99 U/L — ABNORMAL HIGH (ref 0–53)
AST: 97 U/L — ABNORMAL HIGH (ref 0–37)
Albumin: 4.6 g/dL (ref 3.5–5.2)
Alkaline Phosphatase: 62 U/L (ref 39–117)
Bilirubin, Direct: 0.2 mg/dL (ref 0.0–0.3)
Total Bilirubin: 0.9 mg/dL (ref 0.2–1.2)
Total Protein: 7.3 g/dL (ref 6.0–8.3)

## 2024-03-18 LAB — COMPREHENSIVE METABOLIC PANEL
ALT: 99 U/L — ABNORMAL HIGH (ref 0–53)
AST: 97 U/L — ABNORMAL HIGH (ref 0–37)
Albumin: 4.6 g/dL (ref 3.5–5.2)
Alkaline Phosphatase: 62 U/L (ref 39–117)
BUN: 16 mg/dL (ref 6–23)
CO2: 28 meq/L (ref 19–32)
Calcium: 10.3 mg/dL (ref 8.4–10.5)
Chloride: 100 meq/L (ref 96–112)
Creatinine, Ser: 0.98 mg/dL (ref 0.40–1.50)
GFR: 76.72 mL/min (ref >60.00)
Glucose, Bld: 105 mg/dL — ABNORMAL HIGH (ref 70–99)
Potassium: 3.7 meq/L (ref 3.5–5.1)
Sodium: 138 meq/L (ref 135–145)
Total Bilirubin: 0.9 mg/dL (ref 0.2–1.2)
Total Protein: 7.3 g/dL (ref 6.0–8.3)

## 2024-03-18 LAB — CBC
HCT: 42.6 % (ref 39.0–52.0)
Hemoglobin: 14.4 g/dL (ref 13.0–17.0)
MCHC: 33.8 g/dL (ref 30.0–36.0)
MCV: 98.3 fl (ref 78.0–100.0)
Platelets: 211 10*3/uL (ref 150.0–400.0)
RBC: 4.33 Mil/uL (ref 4.22–5.81)
RDW: 13.4 % (ref 11.5–15.5)
WBC: 7.3 10*3/uL (ref 4.0–10.5)

## 2024-03-18 LAB — LIPID PANEL
Cholesterol: 220 mg/dL — ABNORMAL HIGH (ref 0–200)
HDL: 117.4 mg/dL (ref >39.00)
LDL Cholesterol: 91 mg/dL (ref 0–99)
NonHDL: 102.12
Total CHOL/HDL Ratio: 2
Triglycerides: 55 mg/dL (ref 0.0–149.0)
VLDL: 11 mg/dL (ref 0.0–40.0)

## 2024-03-18 LAB — PSA: PSA: 1.46 ng/mL (ref 0.10–4.00)

## 2024-03-18 LAB — TSH: TSH: 1.55 u[IU]/mL (ref 0.35–5.50)

## 2024-03-18 MED ORDER — ATORVASTATIN CALCIUM 80 MG PO TABS: 80.0000 mg | ORAL_TABLET | Freq: Every day | ORAL | 3 refills | Status: AC

## 2024-03-18 MED ORDER — LISINOPRIL-HYDROCHLOROTHIAZIDE 20-12.5 MG PO TABS: 1.0000 | ORAL_TABLET | Freq: Every day | ORAL | 3 refills | Status: AC

## 2024-03-18 NOTE — Patient Instructions (Signed)
 It was great seeing you today   We will follow up with you regarding your lab work   Please let me know if you need anything

## 2024-03-18 NOTE — Progress Notes (Signed)
 Subjective:    Patient ID: Brett King, male    DOB: 1951-02-13, 73 y.o.   MRN: [id number]  HPI Patient presents for yearly preventative medicine examination. He is a pleasant 73 year old male who  has a past medical history of Hyperlipidemia and Hypertension.  Hypertension-is controlled with lisinopril/HCTZ 20-12.5 mg daily.  He denies dizziness, lightheadedness, blurred vision, chest pain, shortness of breath BP Readings from Last 3 Encounters:  03/18/24 123/68  03/15/23 130/78  03/13/22 130/82    Hyperlipidemia-takes Lipitor 80 mg daily.  He denies myalgia or fatigue Lab Results  Component Value Date   CHOL 200 03/15/2023   HDL 113.30 03/15/2023   LDLCALC 78 03/15/2023   LDLDIRECT 124.1 10/27/2013   TRIG 43.0 03/15/2023   CHOLHDL 2 03/15/2023    BPH-asymptomatic.  Not on any medication  All immunizations and health maintenance protocols were reviewed with the patient and needed orders were placed.  Appropriate screening laboratory values were ordered for the patient including screening of hyperlipidemia, renal function and hepatic function. If indicated by BPH, a PSA was ordered.  Medication reconciliation,  past medical history, social history, problem list and allergies were reviewed in detail with the patient  Goals were established with regard to weight loss, exercise, and  diet in compliance with medications. He continues to walk routinely and enjoys walking  Wt Readings from Last 3 Encounters:  03/18/24 164 lb (74.4 kg)  03/15/23 159 lb 4 oz (72.2 kg)  03/13/22 173 lb (78.5 kg)   He is due for colon cancer screening - he refuses.   Review of Systems  Constitutional: Negative.   HENT: Negative.    Eyes: Negative.   Respiratory: Negative.    Cardiovascular: Negative.   Gastrointestinal: Negative.   Endocrine: Negative.   Genitourinary: Negative.   Musculoskeletal: Negative.   Skin: Negative.   Allergic/Immunologic: Negative.   Neurological: Negative.    Hematological: Negative.   Psychiatric/Behavioral: Negative.    All other systems reviewed and are negative.  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, 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    Two children   Multiple Grandchildren    Social Drivers of Corporate investment banker Strain: Low Risk  (09/21/2021)   Overall Financial Resource Strain (CARDIA)    Difficulty of Paying Living Expenses: Not hard at all  Food Insecurity: No Food Insecurity (09/21/2021)   Hunger Vital Sign    Worried About Running Out of Food in the Last Year: Never true    Ran Out of Food in the Last Year: Never true  Transportation Needs: No Transportation Needs (09/21/2021)   PRAPARE - Administrator, Civil Service (Medical): No    Lack of Transportation (Non-Medical): No  Physical Activity: Sufficiently Active (09/21/2021)   Exercise  Vital Sign    Days of Exercise per Week: 3 days    Minutes of Exercise per Session: 50 min  Stress: No Stress Concern Present (09/21/2021)   [employer] of Occupational Health - Occupational Stress Questionnaire    Feeling of Stress : Not at all  Social Connections: Socially Isolated (09/21/2021)   Social Connection and Isolation Panel [NHANES]    Frequency of Communication with Friends and Family: Three times a week    Frequency of Social Gatherings with Friends and Family: Three times a week    Attends Religious  Services: Never    Active Member of Clubs or Organizations: No    Attends Banker Meetings: Never    Marital Status: Widowed  Intimate Partner Violence: Not At Risk (09/21/2021)   Humiliation, Afraid, Rape, and Kick questionnaire    Fear of Current or Ex-Partner: No    Emotionally Abused: No    Physically Abused: No    Sexually Abused: No    Past Surgical History:  Procedure Laterality Date   CHOLECYSTECTOMY      Family History  Problem Relation Age of Onset   Pneumonia Mother    Liver cancer Mother    Pancreatic cancer Mother     No Known Allergies  Current Outpatient Medications on File Prior to Visit  Medication Sig Dispense Refill   atorvastatin (LIPITOR) 80 MG tablet Take 1 tablet (80 mg total) by mouth daily. 90 tablet 3   lisinopril-hydrochlorothiazide (ZESTORETIC) 20-12.5 MG tablet Take 1 tablet by mouth daily. 90 tablet 3   VITAMIN D, CHOLECALCIFEROL, PO Take by mouth.     sildenafil (VIAGRA) 50 MG tablet Take 50 mg by mouth as needed. (Patient not taking: Reported on 03/18/2024)     No current facility-administered medications on file prior to visit.    BP 123/68   Pulse (!) 108   Temp 97.7 F (36.5 C) (Oral)   Ht 5' 10.25" (1.784 m)   Wt 164 lb (74.4 kg)   SpO2 97%   BMI 23.36 kg/m       Objective:   Physical Exam Vitals and nursing note reviewed.  Constitutional:      General: He is not in acute distress.    Appearance: Normal appearance. He is not ill-appearing.  HENT:     Head: Normocephalic and atraumatic.     Right Ear: Tympanic membrane, ear canal and external ear normal. There is no impacted cerumen.     Left Ear: Tympanic membrane, ear canal and external ear normal. There is no impacted cerumen.     Nose: Nose normal. No congestion or rhinorrhea.     Mouth/Throat:     Mouth: Mucous membranes are moist.     Pharynx: Oropharynx is clear.  Eyes:     Extraocular Movements: Extraocular movements intact.     Conjunctiva/sclera:  Conjunctivae normal.     Pupils: Pupils are equal, round, and reactive to light.  Neck:     Vascular: No carotid bruit.  Cardiovascular:     Rate and Rhythm: Normal rate and regular rhythm.     Pulses: Normal pulses.     Heart sounds: No murmur heard.    No friction rub. No gallop.  Pulmonary:     Effort: Pulmonary effort is normal.     Breath sounds: Normal breath sounds.  Abdominal:     General: Abdomen is flat. Bowel sounds are normal. There is no distension.     Palpations: Abdomen is soft. There  is no mass.     Tenderness: There is no abdominal tenderness. There is no guarding or rebound.     Hernia: No hernia is present.  Musculoskeletal:        General: Normal range of motion.     Cervical back: Normal range of motion and neck supple.  Lymphadenopathy:     Cervical: No cervical adenopathy.  Skin:    General: Skin is warm and dry.     Capillary Refill: Capillary refill takes less than 2 seconds.  Neurological:     General: No focal deficit present.     Mental Status: He is alert and oriented to person, place, and time.  Psychiatric:        Mood and Affect: Mood normal.        Behavior: Behavior normal.        Thought Content: Thought content normal.        Judgment: Judgment normal.       Assessment & Plan:  1. Essential hypertension (Primary) - Well controlled. No change in medication  - Lipid panel; Future - TSH; Future - CBC; Future - Comprehensive metabolic panel; Future - lisinopril-hydrochlorothiazide (ZESTORETIC) 20-12.5 MG tablet; Take 1 tablet by mouth daily.  Dispense: 90 tablet; Refill: 3  2. Pure hypercholesterolemia - Continue with statin  - Lipid panel; Future - TSH; Future - CBC; Future - Comprehensive metabolic panel; Future  3. BPH associated with nocturia  - PSA; Future  4. Colon cancer screening declined - He understands his risks, including colon cancer and death.   Shirline Frees, NP

## 2024-09-22 DIAGNOSIS — Z23 Encounter for immunization: Secondary | ICD-10-CM | POA: Diagnosis not present

## 2025-03-19 ENCOUNTER — Encounter: Admitting: Adult Health
